# Patient Record
Sex: Female | Born: 1945 | Race: White | Hispanic: No | Marital: Single | Smoking: Current every day smoker
Health system: Southern US, Community
[De-identification: ages and names within clinical notes are randomized; demographics above are authoritative.]

## PROBLEM LIST (undated history)

## (undated) DIAGNOSIS — E7800 Pure hypercholesterolemia, unspecified: Secondary | ICD-10-CM

## (undated) DIAGNOSIS — I714 Abdominal aortic aneurysm, without rupture, unspecified: Secondary | ICD-10-CM

## (undated) DIAGNOSIS — M5135 Other intervertebral disc degeneration, thoracolumbar region: Secondary | ICD-10-CM

## (undated) HISTORY — PX: ABDOMINAL AORTIC ANEURYSM REPAIR: SUR1152

## (undated) HISTORY — PX: OTHER SURGICAL HISTORY: SHX169

## (undated) HISTORY — PX: ABDOMINAL HYSTERECTOMY: SHX81

## (undated) HISTORY — PX: UTERINE FIBROID SURGERY: SHX826

## (undated) HISTORY — PX: APPENDECTOMY: SHX54

## (undated) HISTORY — PX: TONSILLECTOMY: SUR1361

---

## 2015-01-06 DIAGNOSIS — I10 Essential (primary) hypertension: Secondary | ICD-10-CM | POA: Diagnosis not present

## 2015-01-06 DIAGNOSIS — F17200 Nicotine dependence, unspecified, uncomplicated: Secondary | ICD-10-CM | POA: Diagnosis not present

## 2015-01-06 DIAGNOSIS — I714 Abdominal aortic aneurysm, without rupture: Secondary | ICD-10-CM | POA: Diagnosis not present

## 2015-01-06 DIAGNOSIS — Z9862 Peripheral vascular angioplasty status: Secondary | ICD-10-CM | POA: Diagnosis not present

## 2015-01-06 DIAGNOSIS — Z48812 Encounter for surgical aftercare following surgery on the circulatory system: Secondary | ICD-10-CM | POA: Diagnosis not present

## 2015-01-27 DIAGNOSIS — K089 Disorder of teeth and supporting structures, unspecified: Secondary | ICD-10-CM | POA: Diagnosis not present

## 2015-02-19 ENCOUNTER — Observation Stay (HOSPITAL_COMMUNITY)
Admission: EM | Admit: 2015-02-19 | Discharge: 2015-02-21 | Disposition: A | Discharge status: Home / Self Care | Payer: Medicare Other | Attending: Oncology | Admitting: Oncology

## 2015-02-19 ENCOUNTER — Emergency Department (HOSPITAL_COMMUNITY): Payer: Medicare Other

## 2015-02-19 ENCOUNTER — Encounter (HOSPITAL_COMMUNITY): Payer: Self-pay

## 2015-02-19 DIAGNOSIS — K921 Melena: Secondary | ICD-10-CM | POA: Diagnosis not present

## 2015-02-19 DIAGNOSIS — N179 Acute kidney failure, unspecified: Secondary | ICD-10-CM | POA: Diagnosis not present

## 2015-02-19 DIAGNOSIS — E785 Hyperlipidemia, unspecified: Secondary | ICD-10-CM | POA: Diagnosis not present

## 2015-02-19 DIAGNOSIS — R6889 Other general symptoms and signs: Secondary | ICD-10-CM | POA: Diagnosis not present

## 2015-02-19 DIAGNOSIS — Z90710 Acquired absence of both cervix and uterus: Secondary | ICD-10-CM | POA: Insufficient documentation

## 2015-02-19 DIAGNOSIS — F17210 Nicotine dependence, cigarettes, uncomplicated: Secondary | ICD-10-CM | POA: Insufficient documentation

## 2015-02-19 DIAGNOSIS — R55 Syncope and collapse: Secondary | ICD-10-CM | POA: Diagnosis not present

## 2015-02-19 DIAGNOSIS — S6992XA Unspecified injury of left wrist, hand and finger(s), initial encounter: Secondary | ICD-10-CM | POA: Diagnosis not present

## 2015-02-19 DIAGNOSIS — F0390 Unspecified dementia without behavioral disturbance: Secondary | ICD-10-CM | POA: Diagnosis not present

## 2015-02-19 DIAGNOSIS — M25532 Pain in left wrist: Secondary | ICD-10-CM | POA: Diagnosis not present

## 2015-02-19 DIAGNOSIS — E780 Pure hypercholesterolemia: Secondary | ICD-10-CM | POA: Diagnosis not present

## 2015-02-19 DIAGNOSIS — M5135 Other intervertebral disc degeneration, thoracolumbar region: Secondary | ICD-10-CM | POA: Diagnosis not present

## 2015-02-19 DIAGNOSIS — K922 Gastrointestinal hemorrhage, unspecified: Secondary | ICD-10-CM | POA: Diagnosis not present

## 2015-02-19 DIAGNOSIS — Z9889 Other specified postprocedural states: Secondary | ICD-10-CM | POA: Insufficient documentation

## 2015-02-19 DIAGNOSIS — R001 Bradycardia, unspecified: Secondary | ICD-10-CM

## 2015-02-19 HISTORY — DX: Pure hypercholesterolemia, unspecified: E78.00

## 2015-02-19 HISTORY — DX: Other intervertebral disc degeneration, thoracolumbar region: M51.35

## 2015-02-19 HISTORY — DX: Abdominal aortic aneurysm, without rupture, unspecified: I71.40

## 2015-02-19 HISTORY — DX: Abdominal aortic aneurysm, without rupture: I71.4

## 2015-02-19 LAB — COMPREHENSIVE METABOLIC PANEL
ALK PHOS: 83 U/L (ref 38–126)
ALT: 14 U/L (ref 14–54)
ANION GAP: 9 (ref 5–15)
AST: 20 U/L (ref 15–41)
Albumin: 3.4 g/dL — ABNORMAL LOW (ref 3.5–5.0)
BUN: 32 mg/dL — ABNORMAL HIGH (ref 6–20)
CO2: 22 mmol/L (ref 22–32)
Calcium: 8.9 mg/dL (ref 8.9–10.3)
Chloride: 109 mmol/L (ref 101–111)
Creatinine, Ser: 1.38 mg/dL — ABNORMAL HIGH (ref 0.44–1.00)
GFR calc Af Amer: 44 mL/min — ABNORMAL LOW (ref >60)
GFR calc non Af Amer: 38 mL/min — ABNORMAL LOW (ref >60)
GLUCOSE: 125 mg/dL — AB (ref 65–99)
Potassium: 4.6 mmol/L (ref 3.5–5.1)
SODIUM: 140 mmol/L (ref 135–145)
TOTAL PROTEIN: 6.4 g/dL — AB (ref 6.5–8.1)
Total Bilirubin: 0.5 mg/dL (ref 0.3–1.2)

## 2015-02-19 LAB — I-STAT TROPONIN, ED: TROPONIN I, POC: 0 ng/mL (ref 0.00–0.08)

## 2015-02-19 LAB — CBC
HEMATOCRIT: 37.1 % (ref 36.0–46.0)
HEMATOCRIT: 35 % — AB (ref 36.0–46.0)
HEMOGLOBIN: 12.4 g/dL (ref 12.0–15.0)
Hemoglobin: 11.7 g/dL — ABNORMAL LOW (ref 12.0–15.0)
MCH: 28.1 pg (ref 26.0–34.0)
MCH: 27.9 pg (ref 26.0–34.0)
MCHC: 33.4 g/dL (ref 30.0–36.0)
MCHC: 33.4 g/dL (ref 30.0–36.0)
MCV: 83.9 fL (ref 78.0–100.0)
MCV: 83.5 fL (ref 78.0–100.0)
Platelets: 218 10*3/uL (ref 150–400)
Platelets: 224 10*3/uL (ref 150–400)
RBC: 4.42 MIL/uL (ref 3.87–5.11)
RBC: 4.19 MIL/uL (ref 3.87–5.11)
RDW: 15.1 % (ref 11.5–15.5)
RDW: 15.3 % (ref 11.5–15.5)
WBC: 12 10*3/uL — ABNORMAL HIGH (ref 4.0–10.5)
WBC: 9.6 10*3/uL (ref 4.0–10.5)

## 2015-02-19 LAB — PROTIME-INR
INR: 1.13 (ref 0.00–1.49)
Prothrombin Time: 14.7 seconds (ref 11.6–15.2)

## 2015-02-19 LAB — POC OCCULT BLOOD, ED: Fecal Occult Bld: POSITIVE — AB

## 2015-02-19 LAB — TYPE AND SCREEN
ABO/RH(D): B POS
Antibody Screen: NEGATIVE
DAT, IgG: NEGATIVE

## 2015-02-19 LAB — APTT: APTT: 28 s (ref 24–37)

## 2015-02-19 MED ORDER — ATORVASTATIN CALCIUM 20 MG PO TABS
20.0000 mg | ORAL_TABLET | Freq: Every day | ORAL | Status: DC
  Administered 2015-02-19 – 2015-02-20 (×2): 20 mg via ORAL
  Filled 2015-02-19 (×3): qty 1

## 2015-02-19 MED ORDER — SODIUM CHLORIDE 0.9 % IJ SOLN
3.0000 mL | Freq: Two times a day (BID) | INTRAMUSCULAR | Status: DC
  Administered 2015-02-20: 3 mL via INTRAVENOUS

## 2015-02-19 MED ORDER — PANTOPRAZOLE SODIUM 40 MG PO TBEC
40.0000 mg | DELAYED_RELEASE_TABLET | Freq: Every day | ORAL | Status: DC
  Administered 2015-02-20 – 2015-02-21 (×2): 40 mg via ORAL
  Filled 2015-02-19 (×2): qty 1

## 2015-02-19 MED ORDER — PANTOPRAZOLE SODIUM 40 MG IV SOLR: 40.0000 mg | Freq: Two times a day (BID) | INTRAVENOUS | Status: DC

## 2015-02-19 MED ORDER — DONEPEZIL HCL 10 MG PO TABS
10.0000 mg | ORAL_TABLET | Freq: Every day | ORAL | Status: DC
  Filled 2015-02-19: qty 1

## 2015-02-19 MED ORDER — SODIUM CHLORIDE 0.9 % IV SOLN
INTRAVENOUS | Status: AC
  Administered 2015-02-19 – 2015-02-20 (×2): via INTRAVENOUS

## 2015-02-19 MED ORDER — MEMANTINE HCL 10 MG PO TABS
10.0000 mg | ORAL_TABLET | Freq: Two times a day (BID) | ORAL | Status: DC
  Filled 2015-02-19: qty 1

## 2015-02-19 MED ORDER — PANTOPRAZOLE SODIUM 40 MG IV SOLR
80.0000 mg | Freq: Once | INTRAVENOUS | Status: AC
  Administered 2015-02-19: 80 mg via INTRAVENOUS
  Filled 2015-02-19: qty 80

## 2015-02-19 MED ORDER — SODIUM CHLORIDE 0.9 % IV SOLN
8.0000 mg/h | INTRAVENOUS | Status: DC
  Filled 2015-02-19 (×2): qty 80

## 2015-02-19 MED ORDER — ESCITALOPRAM OXALATE 10 MG PO TABS
10.0000 mg | ORAL_TABLET | Freq: Every day | ORAL | Status: DC
  Administered 2015-02-20 – 2015-02-21 (×2): 10 mg via ORAL
  Filled 2015-02-19 (×2): qty 1

## 2015-02-19 NOTE — ED Notes (Signed)
Lab called to report there was not enough blood in the tube to run CBC and must be recollected.

## 2015-02-19 NOTE — ED Notes (Signed)
Pt. From home. Pt. With hx of AAA approx 6 months ago. Pt. With bright red GI bleeding. Pale and clammy on arrival. Pt. Sitting on toilet upon EMS arrival and slow to answer questions. Pt. AxO x4 now. Pt. States she had a near syncope in the kitchen this AM and caught herself on her L wrist. Complaint of L wrist pain now.

## 2015-02-19 NOTE — ED Notes (Signed)
GI at bedside

## 2015-02-19 NOTE — Progress Notes (Signed)
Briana Hunter [id number] Admission Data: 02/19/2015 3:20 PM Attending Provider: Levert Feinstein, MD  PCP:No primary care provider on file. Consults/ Treatment Team: Treatment Team:  Hilarie Fredrickson, MD  Briana Hunter is a 69 y.o. female patient admitted from ED awake, alert  & orientated  X 1,  No Order, VSS - Blood pressure 132/60, pulse 52, temperature 97.9 F (36.6 C), temperature source Oral, resp. rate 10, SpO2 98 %., , no c/o shortness of breath, no c/o chest pain, no distress noted.   Allergies:  No Known Allergies   Past Medical History  Diagnosis Date  . AAA (abdominal aortic aneurysm)   . Hypercholesteremia   . DDD (degenerative disc disease), thoracolumbar     Pt orientation to unit, room and routine. Information packet given to patient/family and safety video watched.  Admission INP armband ID verified with patient/family, and in place. SR up x 2, fall risk assessment complete with Patient and family verbalizing understanding of risks associated with falls. Pt verbalizes an understanding of how to use the call bell and to call for help before getting out of bed.  Skin, clean-dry- intact without evidence of bruising, or skin tears.   No evidence of skin break down noted on exam.     Will cont to monitor and assist as needed.  Kern Reap, RN 02/19/2015 3:20 PM

## 2015-02-19 NOTE — H&P (Signed)
Date: 02/19/2015               Patient Name:  Briana Hunter MRN: [id number]  DOB: Feb 08, 1946 Age / Sex: 69 y.o., female   PCP: No primary care provider on file.         Medical Service: Internal [facility] Teaching Service         Attending Physician: Dr. Levert Feinstein, MD    First Contact: Dr. Allena Katz  Pager: [phone]   Second Contact: Dr. Yetta Barre  Pager: [phone]        After Hours (After 5p/  First Contact Pager: [phone]  weekends / holidays): Second Contact Pager: [phone]   Chief Complaint: Syncope  History of Present Illness: Briana Hunter is a 69 year old female with AAA status post repair, history of back surgery, dementia who presented with syncope and left wrist swelling. Her son Briana Hunter was present at bedside and contributed to the interview.  Earlier today, she was in the kitchen when she passed out on the floor for roughly 15 seconds though did not hit her head. Her son reports that she was propped up while on the floor and was noted to be shaking. He reports that she fluctuated in consciousness between 10-12 times and had some mild emesis. She herself does not recall the fall though remembers feeling warm and sweaty all over after drinking a cup of coffee. She denies any associated coughing, dizziness, numbness, tingling, prior history of syncope, prior history of CVA, palpitations, recent illness, sick contacts, recent bloody bowel movements, recent hematuria. Per EMS, her initial systolic blood pressures remained in the 80s prior to coming into the hospital.   In December 2015, she presented to [facility] with abdominal pain and was noted to be secondary to an 5.8 cm abdominal aortic aneurysm on CT scan. She underwent repair and was discharged on aspirin 81 mg, metoprolol tartrate 25 mg twice daily, atorvastatin 20 mg. At her follow-up on 01/06/15, abdominal ultrasound was without signs of dilation or peripheral aneurysms, and she was instructed to follow-up should any symptoms  develop. For her primary care, she follows with Briana Hunter] in [place] who prescribes her Namenda 10 mg, Lexapro 10 mg, Aricept 10 mg, Prilosec 20 mg. Her son reports that his grandmother also declined with dementia and that they were working on stopping the Aricept as it was felt it was due to personality changes.  She otherwise recent shortness of breath, chest pain, weight loss, nausea, diarrhea, vomiting, vaginal bleeding, auditory or visual hallucinations. At home, she lives with her son and is able to ambulate and climb stairs as well as perform ADLs. She continues to smoke 6-8 cigarettes a day but denies any recent alcohol use. She was previously living in [place], but her son felt that her dementia may be progressing and thus cares for her now.  In ED, she was noted to have light brown stools intermixed with bright red blood and small clots. FOBT was positive, and she was started on Protonix 80 mg IV. GI was consulted for possible colonoscopy.    Meds: Current Facility-Administered Medications  Medication Dose Route Frequency Provider Last Rate Last Dose  . pantoprazole (PROTONIX) 80 mg in sodium chloride 0.9 % 250 mL (0.32 mg/mL) infusion  8 mg/hr Intravenous Continuous Garlon Hatchet, PA-C      . [START ON 02/23/2015] pantoprazole (PROTONIX) injection 40 mg  40 mg Intravenous Q12H Garlon Hatchet, PA-C       Current Outpatient  Prescriptions  Medication Sig Dispense Refill  . atorvastatin (LIPITOR) 20 MG tablet Take 20 mg by mouth daily.    Marland Kitchen donepezil (ARICEPT) 10 MG tablet Take 10 mg by mouth at bedtime.    Marland Kitchen escitalopram (LEXAPRO) 10 MG tablet Take 10 mg by mouth daily.    . memantine (NAMENDA) 10 MG tablet Take 10 mg by mouth 2 (two) times daily.    . metoprolol tartrate (LOPRESSOR) 25 MG tablet Take 25 mg by mouth 2 (two) times daily.    Marland Kitchen omeprazole (PRILOSEC) 20 MG capsule Take 20 mg by mouth daily.      Allergies: Allergies as of 02/19/2015  . (No Known  Allergies)   Past Medical History  Diagnosis Date  . AAA (abdominal aortic aneurysm)   . Hypercholesteremia   . DDD (degenerative disc disease), thoracolumbar    Past Surgical History  Procedure Laterality Date  . Abdominal aortic aneurysm repair    . Appendectomy    . Tonsillectomy    . Low back surgery      mutiple levels, hardware fixation.   . Abdominal hysterectomy    . Uterine fibroid surgery     No family history on file. History   Social History  . Marital Status: Single    Spouse Name: N/A  . Number of Children: N/A  . Years of Education: N/A   Occupational History  . Not on file.   Social History Main Topics  . Smoking status: Current Every Day Smoker -- 0.50 packs/day  . Smokeless tobacco: Not on file  . Alcohol Use: No  . Drug Use: No  . Sexual Activity: Not on file   Other Topics Concern  . Not on file   Social History Narrative  . No narrative on file    Review of Systems: As noted in the history of present illness  Physical Exam: Blood pressure 132/60, pulse 52, temperature 97.9 F (36.6 C), temperature source Oral, resp. rate 10, SpO2 98 %. General: Elderly Caucasian female, resting in bed, NAD HEENT: Left pupil greater than right though reactive to light, EOMI, no scleral icterus, oropharynx clear Cardiac: Bradycardic, no rubs, murmurs or gallops Pulm: clear to auscultation bilaterally, no wheezes, rales, or rhonchi Abd: soft, nontender, nondistended, BS present Back: Surgical scar noted along side the lower back Ext: warm and well perfused, no pedal or tibial edema, left wrist tender to palpation and appears mildly swollen as compared to the right Neuro: CN II-XII intact, 5/5 upper and lower extremity strength, 2+ grip strength on right though deferred on left given tenderness, 2+patellar reflexes, oriented to place though not town or year [baseline per caregiver]   Lab results: Basic Metabolic Panel:  Recent Labs  00/92/33 1032  NA  140  K 4.6  CL 109  CO2 22  GLUCOSE 125*  BUN 32*  CREATININE 1.38*  CALCIUM 8.9   Liver Function Tests:  Recent Labs  02/19/15 1032  AST 20  ALT 14  ALKPHOS 83  BILITOT 0.5  PROT 6.4*  ALBUMIN 3.4*   CBC:  Recent Labs  02/19/15 1206  WBC 12.0*  HGB 12.4  HCT 37.1  MCV 83.9  PLT 218   Coagulation:  Recent Labs  02/19/15 1032  LABPROT 14.7  INR 1.13   Imaging results:  Dg Wrist Complete Left  02/19/2015   CLINICAL DATA:  Syncope, fall, left wrist pain  EXAM: LEFT WRIST - COMPLETE 3+ VIEW  COMPARISON:  None.  FINDINGS: Four views of  the left wrist submitted. No acute fracture or subluxation. Mild chondrocalcinosis. Mild narrowing of radiocarpal joint space. Degenerative changes first carpometacarpal joint.  IMPRESSION: No acute fracture or subluxation. Mild chondrocalcinosis. Narrowing of radiocarpal joint space. Degenerative changes first carpometacarpal joint.   Electronically Signed   By: Natasha Mead M.D.   On: 02/19/2015 10:51     Other results: EKG: Reviewed and compared with none prior. Sinus bradycardia Normal axis  T-wave flattening noted in V1, 1    Assessment & Plan by Problem:   Ms. Kueker is a 69 year old female with AAA status post repair, history of back surgery, dementia who presented with syncope and left wrist swelling found to have rectal bleeding.  Syncope: Orthostatic syncope possible given the low systolic blood pressures noted by EMS in the setting of along with sinus bradycardia from beta blocker and anticholinergic therapy. Rectal bleeding possibly contributory though hemoglobin 12.4 and admission appears reassuring as it was 9.1 at the time of discharge of her most recent hospitalization. Cardiogenic syncope possible given the sinus bradycardia. Vasovagal syncope possible given her prodromal symptoms though less likely as she does not recall any activities that can increase vagal tone, like coughing, defecation, micturition prior to the  event. Neurologic syncope secondary to causes like TIA/CVA possible given her vascular disease and other risk factors though neurologic exam reassuring. Seizure also less likely as she recalls the events following her fall which would be consistent with a postictal state.  -Check orthostatics and start normal saline at 100 mL/hour -Holding home metoprolol and Aricept, Namenda -Monitor on telemetry & repeat EKG in the morning -Continue neuro checks every 4 hours -Recheck BMET and CBC tomorrow morning -Discontinue Protonix IV and continue Protonix 40 mg daily  Rectal bleeding: Hemoglobin 12.4 on admission. Per records that [facility], her hemoglobin trended down from 13 on 09/04/14 to 9.1 on 09/09/14 at the time of discharge. Unclear why she is having rectal bleeding as she has had no prior history of colonoscopy or GI disease. Per GI assessment, causes include diverticula, hemorrhoids, cancer; cancer is somewhat less likely in the absence of overt weight loss and other progressive GI symptoms.  -CBC as noted above -Consult GI for possible colonoscopy pending completion of syncope workup per their recommendations  Acute kidney injury: Creatinine 1.3 on admission which is consistent with GFR 38, baseline 0.9-1.0 and her most recent hospitalization. Likely prerenal in the setting of acute blood loss and orthostatic hypotension. -IV fluids as noted above  Left wrist swelling: Left wrist x-ray on admission unremarkable for acute fracture. Likely reactive. -Continue assessing symptoms  Hyperglycemia: Glucose 125 on admission though was noted to be as high as 198 at her most recent hospitalization. Does not appear that she has ever been diagnosed with diabetes. -Check A1c  Dementia: She is at her mental baseline per her son. -Continue Lexapro  Hyperlipidemia: Continue Lipitor 40 mg.  Tobacco abuse: Consider nicotine replacement patch per patient request.  #FEN:  -Diet: Clears  #DVT prophylaxis:  SCDs   #CODE STATUS: FULL CODE -Defer to Briana Hunter, son and HCPOA [[phone]] if patients lacks decision-making capacity -Confirmed with patient on admission  Dispo: Disposition is deferred at this time, awaiting improvement of current medical problems.   The patient does have a current PCP (No primary care provider on file.) and does not need an [facility] hospital follow-up appointment after discharge.  The patient does have transportation limitations that hinder transportation to clinic appointments.  Signed: Beather Arbour, MD 02/19/2015, 2:53  PM

## 2015-02-19 NOTE — ED Provider Notes (Signed)
CSN: [id number]     Arrival date & time 02/19/15  1014 History   First MD Initiated Contact with Patient 02/19/15 1015     Chief Complaint  Patient presents with  . GI Bleeding     (Consider location/radiation/quality/duration/timing/severity/associated sxs/prior Treatment) The history is provided by the patient and medical records.   This is a 69 year old female with past medical history significant for hypercholesterolemia and AAA repair approximately 6 months ago at [facility], presenting to the ED for rectal bleeding. Patient was getting ready for her granddaughter's wedding when she used the bathroom and noticed there was bright red blood. She states she did have near syncope where she fell, caught herself on her left wrist.  She does have some left wrist pain.  She did not strike her head or lose consciousness.  She states initially she felt somewhat lightheaded but is feeling better at this time.  Patient is not currently on anti-coagulation--patient is supposed to be on ASA, however has not been taking it.  No hx of GI bleeding.  No prior colonoscopy.  Denies abdominal pain, nausea, vomiting, diarrhea.  No chest pain or SOB.  Patient had follow-up in April 2016 with vascular, cleared from their service until repeat scan needed in 3 years.  Initial BP 80 systolic, improved on arrival here.   Past Medical History  Diagnosis Date  . AAA (abdominal aortic aneurysm)   . Hypercholesteremia    Past Surgical History  Procedure Laterality Date  . Abdominal aortic aneurysm repair    . Appendectomy    . Tonsillectomy     No family history on file. History  Substance Use Topics  . Smoking status: Current Every Day Smoker -- 0.50 packs/day  . Smokeless tobacco: Not on file  . Alcohol Use: No   OB History    No data available     Review of Systems  Gastrointestinal: Positive for blood in stool.  Musculoskeletal: Positive for arthralgias.  Neurological: Positive for syncope.  All other  systems reviewed and are negative.     Allergies  Review of patient's allergies indicates no known allergies.  Home Medications   Prior to Admission medications   Not on File   BP 121/52 mmHg  Pulse 51  Temp(Src) 97.9 F (36.6 C) (Oral)  Resp 10  SpO2 99%   Physical Exam  Constitutional: She is oriented to person, place, and time. She appears well-developed and well-nourished.  HENT:  Head: Normocephalic and atraumatic.  Mouth/Throat: Oropharynx is clear and moist.  Eyes: Conjunctivae and EOM are normal. Pupils are equal, round, and reactive to light.  Neck: Normal range of motion. Neck supple.  Cardiovascular: Normal rate, regular rhythm and normal heart sounds.   Pulmonary/Chest: Effort normal and breath sounds normal. No respiratory distress. She has no wheezes.  Abdominal: Soft. Bowel sounds are normal.  Genitourinary:  Loose, light brown stool noted with intermixed bright red blood, few small clots noted; no noted hemorrhoids or fissures  Musculoskeletal: Normal range of motion.  Neurological: She is alert and oriented to person, place, and time.  Skin: Skin is warm and dry.  Psychiatric: She has a normal mood and affect.  Nursing note and vitals reviewed.   ED Course  Procedures (including critical care time) Labs Review Labs Reviewed  COMPREHENSIVE METABOLIC PANEL - Abnormal; Notable for the following:    Glucose, Bld 125 (*)    BUN 32 (*)    Creatinine, Ser 1.38 (*)    Total Protein 6.4 (*)  Albumin 3.4 (*)    GFR calc non Af Amer 38 (*)    GFR calc Af Amer 44 (*)    All other components within normal limits  CBC - Abnormal; Notable for the following:    WBC 12.0 (*)    All other components within normal limits  POC OCCULT BLOOD, ED - Abnormal; Notable for the following:    Fecal Occult Bld POSITIVE (*)    All other components within normal limits  PROTIME-INR  APTT  CBC WITH DIFFERENTIAL/PLATELET  I-STAT TROPOININ, ED  TYPE AND SCREEN     Imaging Review No results found.   EKG Interpretation None      MDM   Final diagnoses:  Gastrointestinal hemorrhage, unspecified gastritis, unspecified gastrointestinal hemorrhage type  Near syncope  History of AAA (abdominal aortic aneurysm) repair   69 y.o. F with GI bleeding + near syncopal episode PTA.  Initial pressures in the field were 80 systolic, 121/52 on arrival to ED.  Patient afebrile, non-toxic.  Abdominal exam benign.  Stool is light brown intermixed right right red blood and small clots.  Will obtain labs, x-ray of left wrist from fall.    Lab work as above-- H/H stable.  Hemoccult grossly +.  X-ray negative for fracture.  Given ongoing bleeding and near syncopal event, patient will likely need colonoscopy and will be admitted for further management. Patient given protonix bolus and start drip.  Case discussed with GI, PA [place]-- will see patient in consult.  Patient admitted to IM teaching service for further management.  VS remain stable.  Garlon Hatchet, PA-C 02/19/15 1431  Benjiman Core, MD 02/19/15 [phone]

## 2015-02-19 NOTE — Progress Notes (Signed)
Utilization review complete. Tahji  RN CCM Case Mgmt phone [phone] 

## 2015-02-19 NOTE — Consult Note (Signed)
[facility] Consult: 2:49 PM 02/19/2015  LOS: 0 days    Referring Provider: ED PA Evangeline Gula Primary Care Physician:  [facility] in [place].  Primary Gastroenterologist:  unassigned    Reason for Consultation:  Hematochezia.    HPI: Briana Hunter is a 69 y.o. female.  PMH dementia. 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  Previously lived alone in [place] but came to live with son/dtr in law in fall 2015. .    This AM, pt had repeated syncopal spells at home and passed moderate amount of blood per rectum, with clots, into commode. No abdominal pain.  Felt weak, clammy.  Per EMS, initial SBP 80s but BP has responded to IVF.  Once in ED, pt had brown stool in which small amount of blood was visible.  Hgb is 12.4.  Hgb was 12.7 post op. Coags wnl.  FOBT+.   BUN/Creat elevated and were consistently normal pre and post op  No prior GI bleed.  No previous colonoscopy or EGD.  No NSAIDs or ASA.  No n/v.  Eats well, weight stable.  Doing well at all per post op follow ups.  Duplex study 01/06/15: widely patent bifurcated aortic aneurysm repair. No evidence of any significant abdominal aortic dilation. No evidence of any peripheral aneurysms No chest pain or palpitations, no dyspnea.  No hx CAD or CVA.  No previous cardiac cath.  No previous anemia or transfusions.     Past Medical History  Diagnosis Date  . AAA (abdominal aortic aneurysm)   . Hypercholesteremia   . DDD (degenerative disc disease), thoracolumbar     Past Surgical History  Procedure Laterality Date  . Abdominal aortic aneurysm repair    . Appendectomy    . Tonsillectomy    . Low back surgery      mutiple levels, hardware fixation.   . Abdominal hysterectomy    . Uterine fibroid surgery       Prior to Admission medications   Medication Sig Start Date End Date Taking? Authorizing Provider  atorvastatin (LIPITOR) 20 MG tablet Take 20 mg by mouth daily.   Yes Historical Provider, MD  donepezil (ARICEPT) 10 MG tablet Take 10 mg by mouth at bedtime.   Yes Historical Provider, MD  escitalopram (LEXAPRO) 10 MG tablet Take 10 mg by mouth daily.   Yes Historical Provider, MD  memantine (NAMENDA) 10 MG tablet Take 10 mg by mouth 2 (two) times daily.   Yes Historical Provider, MD  metoprolol tartrate (LOPRESSOR) 25 MG tablet Take 25 mg by mouth 2 (two) times daily.   Yes Historical Provider, MD  omeprazole (PRILOSEC) 20 MG capsule Take 20 mg by mouth daily.   Yes Historical Provider, MD    Scheduled Meds: . [START ON 02/23/2015] pantoprazole (PROTONIX) IV  40 mg Intravenous Q12H   Infusions: . pantoprazole (PROTONIX) IV 80 mg (02/19/15 1432)  . pantoprozole (PROTONIX) infusion     PRN Meds:    Allergies as of 02/19/2015  . (No Known Allergies)  No family history on file.  History   Social History  . Marital Status: Single    Spouse Name: N/A  . Number of Children: N/A  . Years of Education: N/A   Occupational History  . Not on file.   Social History Main Topics  . Smoking status: Current Every Day Smoker -- 0.50 packs/day  . Smokeless tobacco: Not on file  . Alcohol Use: No  . Drug Use: No  . Sexual Activity: Not on file   Other Topics Concern  . Not on file   Social History Narrative  . No narrative on file    REVIEW OF SYSTEMS: Constitutional:  Sedentary.   ENT:  No nose bleeds Pulm:  No DOE or cough.  Smokes 6 cigs per day, used to be 2ppd smoker CV:  No palpitations, no LE edema.  GU:  No hematuria, no frequency GI:  Per HPI Heme: no previous issues with excessive bleeding or bruising.    Transfusions:  None ever MS:  Left wrist pain from falling today, xray  Neuro:  No headaches, no peripheral tingling or numbness Derm:  No itching, no rash  or sores.  Endocrine:  No sweats or chills.  No polyuria or dysuria Immunization:  Not queried Travel:  None beyond local counties in last few months.    PHYSICAL EXAM: Vital signs in last 24 hours: Filed Vitals:   02/19/15 1430  BP: 132/60  Pulse: 52  Temp:   Resp: 10   Wt Readings from Last 3 Encounters:  No data found for Wt   General: pleasant, comfortable.  Does not look ill Head:  No signs of trauma, no swelling or asymmetry  Eyes:  No conj pallor.  EOMI Ears:  Not HOH  Nose:  No congestion or discharge Mouth:  All upper teeth and many lower teeth gone.  Moist, pink and clear oral MM. Neck:  No mass or TMG.  No JVD Lungs:  Clear bil.  No ronchi, rales or wheexes.  Not SOB.  No cough Heart: RRR.  No MRG Abdomen:  Soft, NT, ND.  Well healed long, lower midline incision scar.  No mass, no bruits. No HSM   Rectal: , no blood or stool .   Musc/Skeltl: no joint erythema or swelling.  Well healed spinal incision scar on thoracic to lumbar spine. Extremities:  No CCE.  Feet warm  Neurologic:  No tremor or agitation.  Oriented to her DOB and name.  Not aware of year, place, town but appropriately answers "I don't know" Skin:  No telangectasia or rash.  Tattoos:  none Nodes:  No cervical or inguinal nodes   Psych:  Pleasant, cooperative, follows commands.  Not anxious or agitated.   Intake/Output from previous day:   Intake/Output this shift:    LAB RESULTS:  Recent Labs  02/19/15 1206  WBC 12.0*  HGB 12.4  HCT 37.1  PLT 218   BMET Lab Results  Component Value Date   NA 140 02/19/2015   K 4.6 02/19/2015   CL 109 02/19/2015   CO2 22 02/19/2015   GLUCOSE 125* 02/19/2015   BUN 32* 02/19/2015   CREATININE 1.38* 02/19/2015   CALCIUM 8.9 02/19/2015   LFT  Recent Labs  02/19/15 1032  PROT 6.4*  ALBUMIN 3.4*  AST 20  ALT 14  ALKPHOS 83  BILITOT 0.5   PT/INR Lab Results  Component Value Date   INR 1.13 02/19/2015       RADIOLOGY STUDIES: Dg  Wrist Complete Left  02/19/2015   CLINICAL DATA:  Syncope, fall, left wrist pain  EXAM: LEFT WRIST - COMPLETE 3+ VIEW  COMPARISON:  None.  FINDINGS: Four views of the left wrist submitted. No acute fracture or subluxation. Mild chondrocalcinosis. Mild narrowing of radiocarpal joint space. Degenerative changes first carpometacarpal joint.  IMPRESSION: No acute fracture or subluxation. Mild chondrocalcinosis. Narrowing of radiocarpal joint space. Degenerative changes first carpometacarpal joint.   Electronically Signed   By: Natasha Mead M.D.   On: 02/19/2015 10:51    ENDOSCOPIC STUDIES: No EGD or colonoscopy  IMPRESSION:   *  Painless hematochezia. Volume does not sound large.  R/o diverticular source, r/ hemorrhoidal (none seen or palpated on exam though), r/o neoplasia  *  Syncope. Suspect related to GIB and hypotension.  No head imaging thus far.   *  AKI   *  Dementia.  On Aricept and Namenda.    *  S/p aortobifem repair of juxtarenal aortic aneurysm 08/2014.    PLAN:     *  Observe.  Stop Protonix drip, resume her usual once daily oral PPI.   *  Clears. CBC in AM.    Jennye Moccasin  02/19/2015, 2:49 PM Pager: [phone]  GI ATTENDING  History, laboratories reviewed. Patient personally seen and examined. Agree with consultation note as outlined above. I also discussed the history with her son and emergency room medical assistant. Patient was brought in with syncope. She is a poor historian due to dementia. Apparently EMS reported noticing blood in the toilet bowl after she defecated. Bowel movement in the ER was described as brown with streaks of red blood. My rectal examination revealed no stool in the vault. There was scant brown material with mucus and pink tinge of blood. Vital signs stable. Hemoglobin normal. IMPRESSION 1. Minor rectal bleeding 2. Syncope.  Unrelated to minor GI bleeding RECOMMENDATIONS 1. Primary service to evaluate syncope 2. GI available if needed over the  weekend. After syncope workup completed, and if no contraindication, could perform colonoscopy to evaluate minor rectal bleeding. Dr. Elnoria Howard on call this weekend should assistance be needed.  Discussed with son  Wilhemina Bonito. Eda Keys., M.D. [facility] of Gastroenterology

## 2015-02-20 DIAGNOSIS — R55 Syncope and collapse: Secondary | ICD-10-CM | POA: Diagnosis not present

## 2015-02-20 DIAGNOSIS — R001 Bradycardia, unspecified: Secondary | ICD-10-CM

## 2015-02-20 DIAGNOSIS — E785 Hyperlipidemia, unspecified: Secondary | ICD-10-CM

## 2015-02-20 DIAGNOSIS — F17200 Nicotine dependence, unspecified, uncomplicated: Secondary | ICD-10-CM

## 2015-02-20 DIAGNOSIS — F0390 Unspecified dementia without behavioral disturbance: Secondary | ICD-10-CM

## 2015-02-20 DIAGNOSIS — K921 Melena: Secondary | ICD-10-CM | POA: Diagnosis not present

## 2015-02-20 DIAGNOSIS — M7989 Other specified soft tissue disorders: Secondary | ICD-10-CM

## 2015-02-20 DIAGNOSIS — R7309 Other abnormal glucose: Secondary | ICD-10-CM

## 2015-02-20 DIAGNOSIS — N179 Acute kidney failure, unspecified: Secondary | ICD-10-CM

## 2015-02-20 DIAGNOSIS — Z9889 Other specified postprocedural states: Secondary | ICD-10-CM | POA: Insufficient documentation

## 2015-02-20 LAB — CBC
HCT: 34.2 % — ABNORMAL LOW (ref 36.0–46.0)
Hemoglobin: 11.3 g/dL — ABNORMAL LOW (ref 12.0–15.0)
MCH: 27.8 pg (ref 26.0–34.0)
MCHC: 33 g/dL (ref 30.0–36.0)
MCV: 84 fL (ref 78.0–100.0)
PLATELETS: 189 10*3/uL (ref 150–400)
RBC: 4.07 MIL/uL (ref 3.87–5.11)
RDW: 15.3 % (ref 11.5–15.5)
WBC: 7.5 10*3/uL (ref 4.0–10.5)

## 2015-02-20 LAB — BASIC METABOLIC PANEL
Anion gap: 5 (ref 5–15)
BUN: 19 mg/dL (ref 6–20)
CHLORIDE: 109 mmol/L (ref 101–111)
CO2: 25 mmol/L (ref 22–32)
Calcium: 8.6 mg/dL — ABNORMAL LOW (ref 8.9–10.3)
Creatinine, Ser: 1.07 mg/dL — ABNORMAL HIGH (ref 0.44–1.00)
GFR calc non Af Amer: 51 mL/min — ABNORMAL LOW (ref >60)
GFR, EST AFRICAN AMERICAN: 60 mL/min — AB (ref >60)
Glucose, Bld: 91 mg/dL (ref 65–99)
POTASSIUM: 3.6 mmol/L (ref 3.5–5.1)
Sodium: 139 mmol/L (ref 135–145)

## 2015-02-20 MED ORDER — ASPIRIN 81 MG PO CHEW
81.0000 mg | CHEWABLE_TABLET | Freq: Every day | ORAL | Status: DC
  Administered 2015-02-20 – 2015-02-21 (×2): 81 mg via ORAL
  Filled 2015-02-20 (×2): qty 1

## 2015-02-20 NOTE — Progress Notes (Addendum)
Subjective: This morning, her son was at bedside. She denied any complaints, including bloody bowel movements. We explained to her that we think her symptoms yesterday may been precipitated by one of the medications she was started on following her most recent surgery. Overnight review of telemetry was notable for sinus bradycardia, we explained that we will monitor her closely as we weigh the risks and benefits of restarting this medication given her recent procedure.  Per chart review, at her last office visit with vascular surgery, her pulse was noted to be 51 with instructions to continue aspirin but no mention of metoprolol.   Objective: Vital signs in last 24 hours: Filed Vitals:   02/19/15 1834 02/19/15 1840 02/19/15 2124 02/20/15 0540  BP:   139/70 143/70  Pulse:   49 56  Temp: 98.8 F (37.1 C)  97.9 F (36.6 C) 98.6 F (37 C)  TempSrc: Oral  Oral Oral  Resp: 20  18 18   Height:  5\' 6"  (1.676 m)    Weight:  131 lb 1.6 oz (59.467 kg)    SpO2: 100%  99% 98%   Weight change:   Intake/Output Summary (Last 24 hours) at 02/20/15 1020 Last data filed at 02/19/15 1844  Gross per 24 hour  Intake 393.33 ml  Output      0 ml  Net 393.33 ml   General: Elderly Caucasian female, resting in bed, NAD HEENT: Pupils equal in size and reactive to light, cataract surgery, EOMI, no scleral icterus, oropharynx clear Cardiac: Bradycardic, no rubs, murmurs or gallops Pulm: clear to auscultation bilaterally, no wheezes, rales, or rhonchi Abd: soft, nontender, nondistended, BS present Back: Surgical scar noted along side the lower back Ext: warm and well perfused, covered in SCDs, left wrist mildly swollen as compared to the right stable from yesterday Neuro: Responds to questions appropriately, moving all extremities spontaneously  Lab Results: Basic Metabolic Panel:  Recent Labs Lab 02/19/15 1032 02/20/15 0616  NA 140 139  K 4.6 3.6  CL 109 109  CO2 22 25  GLUCOSE 125* 91  BUN  32* 19  CREATININE 1.38* 1.07*  CALCIUM 8.9 8.6*   Liver Function Tests:  Recent Labs Lab 02/19/15 1032  AST 20  ALT 14  ALKPHOS 83  BILITOT 0.5  PROT 6.4*  ALBUMIN 3.4*   CBC:  Recent Labs Lab 02/19/15 1630 02/20/15 0616  WBC 9.6 7.5  HGB 11.7* 11.3*  HCT 35.0* 34.2*  MCV 83.5 84.0  PLT 224 189   Coagulation:  Recent Labs Lab 02/19/15 1032  LABPROT 14.7  INR 1.13   Studies/Results: Dg Wrist Complete Left  02/19/2015   CLINICAL DATA:  Syncope, fall, left wrist pain  EXAM: LEFT WRIST - COMPLETE 3+ VIEW  COMPARISON:  None.  FINDINGS: Four views of the left wrist submitted. No acute fracture or subluxation. Mild chondrocalcinosis. Mild narrowing of radiocarpal joint space. Degenerative changes first carpometacarpal joint.  IMPRESSION: No acute fracture or subluxation. Mild chondrocalcinosis. Narrowing of radiocarpal joint space. Degenerative changes first carpometacarpal joint.   Electronically Signed   By: Briana Hunter M.D.   On: 02/19/2015 10:51   Medications: I have reviewed the patient's current medications. Scheduled Meds: . atorvastatin  20 mg Oral q1800  . escitalopram  10 mg Oral Daily  . pantoprazole  40 mg Oral Q0600  . sodium chloride  3 mL Intravenous Q12H   Continuous Infusions:  PRN Meds:. Assessment/Plan:  Briana Hunter is a 69 year old female with AAA status post repair,  history of back surgery, dementia who presented with syncope and left wrist swelling found to have rectal bleeding.  Syncope: Likely multifactorial given her prodrome noted in the setting of sinus bradycardia secondary to her medications [metoprolol with some contributory effects from Aricept]. Orthostatic vital signs reassuring yesterday, and her hemoglobin is stable at 11-12 since admission. No changes neurologically since yesterday. EKG this morning notable for heart rate 69 though telemetry overnight showed persistent sinus bradycardia off her home metoprolol. -Continue to hold  metoprolol after speaking with Dr. Elease Hashimoto [Cardiology] given her persistent sinus bradycardia but will monitor her blood pressure for other antihypertensive therapy -Holding Aricept and Namenda for potentially contributory effects -Monitor on telemetry   Rectal bleeding: Hemoglobin stable as noted above. Causes of her rectal bleeding include diverticula, hemorrhoids, cancer; cancer is somewhat less likely in the absence of overt weight loss and other progressive GI symptoms. No prior history of colonoscopy or GI disease.  -CBC as noted above -Defer colonoscopy for outpatient unless she has a significant drop in Hb -Resume home ASA  -Continue Protonix 40 mg daily  Acute kidney injury: Creatinine 1.1, improved from 1.3 on admission, baseline 0.9-1.0 at her most recent hospitalization. Likely prerenal though she did not have significant blood loss or orthostatic vital signs. -Recheck BMET  Left wrist swelling: Left wrist x-ray on admission unremarkable for acute fracture. Swelling likely reactive. -Continue assessing symptoms  Hyperglycemia: Glucose 91, improved from 125 on admission though was noted to be as high as 198 at her most recent hospitalization.  -A1c pending  Dementia: She is at her mental baseline per her son. -Continue Lexapro  Hyperlipidemia: Continue Lipitor 40 mg.  Tobacco abuse: Consider nicotine replacement patch per patient request.  #FEN:  -Diet: Regular  #DVT prophylaxis: SCDs  #CODE STATUS: FULL CODE -Defer to Briana Hunter, son and HCPOA [phone]] if patients lacks decision-making capacity -Confirmed with patient on admission  Dispo: Disposition is deferred at this time, awaiting improvement of current medical problems.   The patient does have a current PCP Briana Ohara, MD) and does not need an [facility] hospital follow-up appointment after discharge.  The patient does have transportation limitations that hinder transportation to clinic  appointments.  .Services Needed at time of discharge: Y = Yes, Blank = No PT:   OT:   RN:   Equipment:   Other:     LOS: 1 day   Briana Arbour, MD 02/20/2015, 10:20 AM

## 2015-02-20 NOTE — Discharge Summary (Signed)
Name: Briana Hunter MRN: [id number] DOB: [birth year] 70 y.o. PCP: Briana Ohara, MD  Date of Admission: 02/19/2015 10:14 AM Date of Discharge: 02/21/15 Attending Physician: Dr. Cyndie Chime  Discharge Diagnosis: Syncope of multifactorial etiology Rectal bleeding Acute kidney injury Left wrist swelling Dementia AAA status post repair Hyperlipidemia Tobacco abuse  Discharge Medications:   Medication List    STOP taking these medications        donepezil 10 MG tablet  Commonly known as:  ARICEPT     memantine 10 MG tablet  Commonly known as:  NAMENDA     metoprolol tartrate 25 MG tablet  Commonly known as:  LOPRESSOR      TAKE these medications        amLODipine 5 MG tablet  Commonly known as:  NORVASC  Take 1 tablet (5 mg total) by mouth daily.     aspirin 81 MG chewable tablet  Chew 1 tablet (81 mg total) by mouth daily.     atorvastatin 20 MG tablet  Commonly known as:  LIPITOR  Take 20 mg by mouth daily.     escitalopram 10 MG tablet  Commonly known as:  LEXAPRO  Take 10 mg by mouth daily.     omeprazole 20 MG capsule  Commonly known as:  PRILOSEC  Take 20 mg by mouth daily.        Disposition and follow-up:   Briana Hunter was discharged from [facility] in Stable condition.  At the hospital follow up visit please address:  Discontinuing Aricept, Namenda  Blood pressure control with Norvasc  Resolution of rectal bleeding: Referral for outpatient colonoscopy  Resolution of AKI: Recheck BMET   Follow-up Appointments: Follow-up Information    Follow up with Briana Ohara, MD. Schedule an appointment as soon as possible for a visit in 1 week.   Specialty:  Family Medicine   Contact information:   [address] [place] [address] [phone]       Discharge Instructions:   Consultations: Treatment Team:  Hilarie Fredrickson, MD  Procedures Performed:  Dg Wrist Complete Left  02/19/2015   CLINICAL DATA:  Syncope,  fall, left wrist pain  EXAM: LEFT WRIST - COMPLETE 3+ VIEW  COMPARISON:  None.  FINDINGS: Four views of the left wrist submitted. No acute fracture or subluxation. Mild chondrocalcinosis. Mild narrowing of radiocarpal joint space. Degenerative changes first carpometacarpal joint.  IMPRESSION: No acute fracture or subluxation. Mild chondrocalcinosis. Narrowing of radiocarpal joint space. Degenerative changes first carpometacarpal joint.   Electronically Signed   By: Natasha Mead M.D.   On: 02/19/2015 10:51     Admission HPI: Briana Hunter is a 69 year old female with AAA status post repair, history of back surgery, dementia who presented with syncope and left wrist swelling. [name] and contributed to the interview.  Earlier today, she was in the kitchen when she passed out on the floor for roughly 15 seconds though did not hit her head. Her son reports that she was propped up while on the floor and was noted to be shaking. He reports that she fluctuated in consciousness between 10-12 times and had some mild emesis. She herself does not recall the fall though remembers feeling warm and sweaty all over after drinking a cup of coffee. She denies any associated coughing, dizziness, numbness, tingling, prior history of syncope, prior history of CVA, palpitations, recent illness, sick contacts, recent bloody bowel movements, recent hematuria. Per EMS, her initial systolic  blood pressures remained in the 80s prior to coming into the hospital.   In December 2015, she presented to [facility] with abdominal pain and was noted to be secondary to an 5.8 cm abdominal aortic aneurysm on CT scan. She underwent repair and was discharged on aspirin 81 mg, metoprolol tartrate 25 mg twice daily, atorvastatin 20 mg. At her follow-up on 01/06/15, abdominal ultrasound was without signs of dilation or peripheral aneurysms, and she was instructed to follow-up should any symptoms develop. For her primary care,  she follows with Dr. [facility]] in [place] who prescribes her Namenda 10 mg, Lexapro 10 mg, Aricept 10 mg, Prilosec 20 mg. Her son reports that his grandmother also declined with dementia and that they were working on stopping the Aricept as it was felt it was due to personality changes.  She otherwise recent shortness of breath, chest pain, weight loss, nausea, diarrhea, vomiting, vaginal bleeding, auditory or visual hallucinations. At home, she lives with her son and is able to ambulate and climb stairs as well as perform ADLs. She continues to smoke 6-8 cigarettes a day but denies any recent alcohol use. She was previously living in [place], but her son felt that her dementia may be progressing and thus cares for her now.  In ED, she was noted to have light brown stools intermixed with bright red blood and small clots. FOBT was positive, and she was started on Protonix 80 mg IV. GI was consulted for possible colonoscopy.  Hospital Course by problem list:  Syncope: Likely multifactorial given her prodrome noted in the setting of sinus bradycardia secondary to her medications [metoprolol with some contributory effects from Aricept]. Orthostatic vital signs reassuring on admission though she was orthostatic on the day of discharge and received 500 mL fluid bolus. Aricept and metoprolol were held throughout her hospital stay, and telemetry findings was notable for sinus bradycardia. Cardiology agreed with management and recommended another antihypertensive, so she was started on Norvasc 5 mg. Please assess blood pressure control the time of follow-up.  Rectal bleeding: GI was consulted and attributed her rectal bleeding to hemorrhoids, diverticula, or neoplasia but deferred colonoscopy to outpatient as her hemoglobin was stable as noted above. She did report some loose stools with scant blood on the day of discharge, and the symptoms should be reassessed at the time of follow-up along with  referral for colonoscopy.   Acute kidney injury: Likely prerenal in the setting of acute blood loss and orthostatic hypotension. Creatinine 1.3 initially trended down on hospital day 1 the return to 1 throughout the day of discharge, which is above her baseline 0.9-1.0. BMET should be rechecked at the time of follow-up to assess resolution of AKI.  Left wrist swelling: Likely reactive to trauma sustained during her fall. Left wrist x-ray on admission unremarkable for acute fracture.  Hyperglycemia: Glucose 125 on admission. A1c was noted to be 5.8.   Dementia remained stable on Lexapro. Namenda and Aricept were discontinued as they are thought to be contributory to her syncopal episode.  Hyperlipidemia: Stable on home medications  Tobacco abuse: She is advised smoking cessation and should be followed up at the time of her next appointment.  AAA status post repair: She was continued on aspirin 81 mg.  Discharge Vitals:   BP 141/76 mmHg  Pulse 67  Temp(Src) 98.3 F (36.8 C) (Oral)  Resp 18  Ht  (1.676 m)  Wt 131 lb 1.6 oz (59.467 kg)  BMI 21.17 kg/m2  SpO2  100%  Discharge Labs:  No results found for this or any previous visit (from the past 24 hour(s)).  Signed: Beather Arbour, MD 02/25/2015, 4:25 PM    Services Ordered on Discharge: None  Equipment Ordered on Discharge: None

## 2015-02-21 DIAGNOSIS — F0390 Unspecified dementia without behavioral disturbance: Secondary | ICD-10-CM | POA: Diagnosis not present

## 2015-02-21 DIAGNOSIS — N179 Acute kidney failure, unspecified: Secondary | ICD-10-CM | POA: Diagnosis not present

## 2015-02-21 DIAGNOSIS — K921 Melena: Secondary | ICD-10-CM | POA: Diagnosis not present

## 2015-02-21 DIAGNOSIS — R55 Syncope and collapse: Secondary | ICD-10-CM | POA: Diagnosis not present

## 2015-02-21 LAB — BASIC METABOLIC PANEL
Anion gap: 7 (ref 5–15)
BUN: 20 mg/dL (ref 6–20)
CALCIUM: 8.8 mg/dL — AB (ref 8.9–10.3)
CO2: 23 mmol/L (ref 22–32)
CREATININE: 1.26 mg/dL — AB (ref 0.44–1.00)
Chloride: 108 mmol/L (ref 101–111)
GFR calc non Af Amer: 42 mL/min — ABNORMAL LOW (ref >60)
GFR, EST AFRICAN AMERICAN: 49 mL/min — AB (ref >60)
GLUCOSE: 97 mg/dL (ref 65–99)
POTASSIUM: 4 mmol/L (ref 3.5–5.1)
SODIUM: 138 mmol/L (ref 135–145)

## 2015-02-21 LAB — CBC
HCT: 33.5 % — ABNORMAL LOW (ref 36.0–46.0)
Hemoglobin: 11.1 g/dL — ABNORMAL LOW (ref 12.0–15.0)
MCH: 28.5 pg (ref 26.0–34.0)
MCHC: 33.1 g/dL (ref 30.0–36.0)
MCV: 85.9 fL (ref 78.0–100.0)
PLATELETS: 201 10*3/uL (ref 150–400)
RBC: 3.9 MIL/uL (ref 3.87–5.11)
RDW: 15 % (ref 11.5–15.5)
WBC: 8.1 10*3/uL (ref 4.0–10.5)

## 2015-02-21 MED ORDER — ASPIRIN 81 MG PO CHEW: 81.0000 mg | CHEWABLE_TABLET | Freq: Every day | ORAL | Status: AC

## 2015-02-21 MED ORDER — AMLODIPINE BESYLATE 5 MG PO TABS: 5.0000 mg | ORAL_TABLET | Freq: Every day | ORAL | Status: AC

## 2015-02-21 MED ORDER — SODIUM CHLORIDE 0.9 % IV BOLUS (SEPSIS)
500.0000 mL | Freq: Once | INTRAVENOUS | Status: AC
  Administered 2015-02-21: 500 mL via INTRAVENOUS

## 2015-02-21 MED ORDER — METOPROLOL TARTRATE 12.5 MG HALF TABLET
12.5000 mg | ORAL_TABLET | Freq: Two times a day (BID) | ORAL | Status: DC
  Filled 2015-02-21 (×2): qty 1

## 2015-02-21 MED ORDER — AMLODIPINE BESYLATE 5 MG PO TABS
5.0000 mg | ORAL_TABLET | Freq: Every day | ORAL | Status: DC
  Administered 2015-02-21: 5 mg via ORAL
  Filled 2015-02-21: qty 1

## 2015-02-21 NOTE — Progress Notes (Signed)
Subjective: Tachycardic on telemetry this AM. It appears that she becomes tachy into the 120's w/ some activity, appears sinus on telemetry. Asymptomatic. States she has had some diarrhea overnight w/ some small amount of blood. No dizziness, lightheadedness, nausea, vomiting, palpitations, or abdominal pain.    Objective: Vital signs in last 24 hours: Filed Vitals:   02/19/15 2124 02/20/15 0540 02/20/15 2143 02/21/15 0612  BP: 139/70 143/70 141/69 145/70  Pulse: 49 56  105  Temp: 97.9 F (36.6 C) 98.6 F (37 C) 98.6 F (37 C) 98.3 F (36.8 C)  TempSrc: Oral Oral Oral Oral  Resp: Height:      Weight:      SpO2: 99% 98% 99% 95%   Weight change:  No intake or output data in the 24 hours ending 02/21/15 1002  Physical Exam: General: Elderly white female, alert, cooperative, NAD. HEENT: PERRL, EOMI. Moist mucus membranes Neck: Full range of motion without pain, supple, no lymphadenopathy or carotid bruits Lungs: Clear to ascultation bilaterally, normal work of respiration, no wheezes, rales, rhonchi Heart: Mild tachycardia, no murmurs, gallops, or rubs Abdomen: Soft, non-tender, non-distended, BS + Extremities: No cyanosis, clubbing, or edema Neurologic: Alert & oriented x1, cranial nerves II-XII intact, strength grossly intact, sensation intact to light touch    Lab Results: Basic Metabolic Panel:  Recent Labs Lab 02/20/15 0616 02/21/15 0555  NA 139 138  K 3.6 4.0  CL 109 108  CO2 25 23  GLUCOSE 91 97  BUN 19 20  CREATININE 1.07* 1.26*  CALCIUM 8.6* 8.8*   Liver Function Tests:  Recent Labs Lab 02/19/15 1032  AST 20  ALT 14  ALKPHOS 83  BILITOT 0.5  PROT 6.4*  ALBUMIN 3.4*   CBC:  Recent Labs Lab 02/19/15 1630 02/20/15 0616  WBC 9.6 7.5  HGB 11.7* 11.3*  HCT 35.0* 34.2*  MCV 83.5 84.0  PLT 224 189   Coagulation:  Recent Labs Lab 02/19/15 1032  LABPROT 14.7  INR 1.13   Studies/Results: Dg Wrist Complete  Left  02/19/2015   CLINICAL DATA:  Syncope, fall, left wrist pain  EXAM: LEFT WRIST - COMPLETE 3+ VIEW  COMPARISON:  None.  FINDINGS: Four views of the left wrist submitted. No acute fracture or subluxation. Mild chondrocalcinosis. Mild narrowing of radiocarpal joint space. Degenerative changes first carpometacarpal joint.  IMPRESSION: No acute fracture or subluxation. Mild chondrocalcinosis. Narrowing of radiocarpal joint space. Degenerative changes first carpometacarpal joint.   Electronically Signed   By: Natasha Mead M.D.   On: 02/19/2015 10:51   Medications: I have reviewed the patient's current medications. Scheduled Meds: . aspirin  81 mg Oral Daily  . atorvastatin  20 mg Oral q1800  . escitalopram  10 mg Oral Daily  . pantoprazole  40 mg Oral Q0600  . sodium chloride  3 mL Intravenous Q12H   Continuous Infusions:  PRN Meds:.  Assessment/Plan: 69 y/o F w/ PMHX AAA s/p repair in 2015, h/o back surgery, and dementia, admitted for syncope and rectal bleeding.   Syncope: Etiology unclear, may be related to bradycardia vs vasovagal event. Seems likely that patient has been beta blocked, unable to compensate while standing given her bradycardia. Patient w/ bradycardia while at rest, increases to the 100's w/ activity. Patient asymptomatic. Denies dizziness, lightheadedness, or palpitations. No chest pain or SOB. Orthostatic on exam this AM.  -Give 500 cc bolus -HOLD further beta blocker -Can start Norvasc 5 mg daily for BP control.  Rectal bleeding: Hemoglobin stable, most likely related to hemorrhoids. Patient does note some loose stools w/ scant blood today. CBC trend as follows:   Recent Labs Lab 02/19/15 1630 02/20/15 0616 02/21/15 1050  HGB 11.7* 11.3* 11.1*  HCT 35.0* 34.2* 33.5*  WBC 9.6 7.5 8.1  PLT 224 189 201  -Defer colonoscopy for outpatient -Continue Protonix 40 mg daily  AKI: Cr slightly elevated from baseline today, still likely dry given orthostatic changes.   -Recheck BMP as an outpatient  Dementia: She is at her mental baseline per her son. -Continue Lexapro -Hold Aricept + Namenda until patient sees PCP given syncope  Hyperlipidemia: Continue Lipitor 40 mg.  Tobacco abuse: Consider nicotine replacement patch per patient request.  #FEN:  -Diet: Regular  #DVT prophylaxis: SCDs  #CODE STATUS: FULL CODE -Defer to [name], son and HCPOA [phone]] if patients lacks decision-making capacity -Confirmed with patient on admission  Dispo: Disposition is deferred at this time, awaiting improvement of current medical problems.   The patient does have a current PCP [name], MD) and does not need an [facility] hospital follow-up appointment after discharge.  The patient does have transportation limitations that hinder transportation to clinic appointments.  .Services Needed at time of discharge: Y = Yes, Blank = No PT:   OT:   RN:   Equipment:   Other:     LOS: 2 days   Courtney Paris, MD 02/21/2015, 10:02 AM

## 2015-02-21 NOTE — Progress Notes (Signed)
02/21/15 Patient being discharged home with son. Discharge instructions reviewed with family.

## 2015-02-21 NOTE — Discharge Instructions (Signed)
1. You have a follow up appointment as follows:  Briana Hunter, Briana Hunter  Schedule an appointment as soon as possible for a visit in 1 week  [address] [place] [address] [phone]  2. Please take all medications as previously prescribed with the following changes:  Stop taking Metoprolol for your blood pressure.   Start taking Norvasc 5 mg daily instead of the Metoprolol.  3. If you have worsening of your symptoms or new symptoms arise, please call the clinic ([phone]), or go to the ER immediately if symptoms are severe.   Syncope Syncope is a medical term for fainting or passing out. This means you lose consciousness and drop to the ground. People are generally unconscious for less than 5 minutes. You may have some muscle twitches for up to 15 seconds before waking up and returning to normal. Syncope occurs more often in older adults, but it can happen to anyone. While most causes of syncope are not dangerous, syncope can be a sign of a serious medical problem. It is important to seek medical care.  CAUSES  Syncope is caused by a sudden drop in blood flow to the brain. The specific cause is often not determined. Factors that can bring on syncope include:  Taking medicines that lower blood pressure.  Sudden changes in posture, such as standing up quickly.  Taking more medicine than prescribed.  Standing in one place for too long.  Seizure disorders.  Dehydration and excessive exposure to heat.  Low blood sugar (hypoglycemia).  Straining to have a bowel movement.  Heart disease, irregular heartbeat, or other circulatory problems.  Fear, emotional distress, seeing blood, or severe pain. SYMPTOMS  Right before fainting, you may:  Feel dizzy or light-headed.  Feel nauseous.  See all white or all black in your field of vision.  Have cold, clammy skin. DIAGNOSIS  Your health care provider will ask about your symptoms, perform a physical exam, and perform an  electrocardiogram (ECG) to record the electrical activity of your heart. Your health care provider may also perform other heart or blood tests to determine the cause of your syncope which may include:  Transthoracic echocardiogram (TTE). During echocardiography, sound waves are used to evaluate how blood flows through your heart.  Transesophageal echocardiogram (TEE).  Cardiac monitoring. This allows your health care provider to monitor your heart rate and rhythm in real time.  Holter monitor. This is a portable device that records your heartbeat and can help diagnose heart arrhythmias. It allows your health care provider to track your heart activity for several days, if needed.  Stress tests by exercise or by giving medicine that makes the heart beat faster. TREATMENT  In most cases, no treatment is needed. Depending on the cause of your syncope, your health care provider may recommend changing or stopping some of your medicines. HOME CARE INSTRUCTIONS  Have someone stay with you until you feel stable.  Do not drive, use machinery, or play sports until your health care provider says it is okay.  Keep all follow-up appointments as directed by your health care provider.  Lie down right away if you start feeling like you might faint. Breathe deeply and steadily. Wait until all the symptoms have passed.  Drink enough fluids to keep your urine clear or pale yellow.  If you are taking blood pressure or heart medicine, get up slowly and take several minutes to sit and then stand. This can reduce dizziness. SEEK IMMEDIATE MEDICAL CARE IF:   You  have a severe headache.  You have unusual pain in the chest, abdomen, or back.  You are bleeding from your mouth or rectum, or you have black or tarry stool.  You have an irregular or very fast heartbeat.  You have pain with breathing.  You have repeated fainting or seizure-like jerking during an episode.  You faint when sitting or lying  down.  You have confusion.  You have trouble walking.  You have severe weakness.  You have vision problems. If you fainted, call your local emergency services (911 in [place] Do not drive yourself to the hospital.  MAKE SURE YOU:  Understand these instructions.  Will watch your condition.  Will get help right away if you are not doing well or get worse. Document Released: 09/18/2005 Document Revised: 09/23/2013 Document Reviewed: 11/17/2011 [facility] Patient Information 2015 [place], [place]. This information is not intended to replace advice given to you by your health care provider. Make sure you discuss any questions you have with your health care provider.

## 2015-02-22 LAB — HEMOGLOBIN A1C
HEMOGLOBIN A1C: 5.8 % — AB (ref 4.8–5.6)
Mean Plasma Glucose: 120 mg/dL

## 2015-03-03 DIAGNOSIS — R55 Syncope and collapse: Secondary | ICD-10-CM | POA: Diagnosis not present

## 2015-10-19 DIAGNOSIS — R0682 Tachypnea, not elsewhere classified: Secondary | ICD-10-CM | POA: Diagnosis not present

## 2015-10-19 DIAGNOSIS — R52 Pain, unspecified: Secondary | ICD-10-CM | POA: Diagnosis not present

## 2015-10-19 DIAGNOSIS — S12112A Nondisplaced Type II dens fracture, initial encounter for closed fracture: Secondary | ICD-10-CM | POA: Diagnosis not present

## 2015-10-19 DIAGNOSIS — S129XXA Fracture of neck, unspecified, initial encounter: Secondary | ICD-10-CM | POA: Diagnosis not present

## 2015-10-19 DIAGNOSIS — M542 Cervicalgia: Secondary | ICD-10-CM | POA: Diagnosis not present

## 2015-10-19 DIAGNOSIS — H9209 Otalgia, unspecified ear: Secondary | ICD-10-CM | POA: Diagnosis not present

## 2015-10-27 ENCOUNTER — Ambulatory Visit (HOSPITAL_COMMUNITY): Payer: Medicare Other

## 2015-10-27 ENCOUNTER — Emergency Department (HOSPITAL_COMMUNITY): Payer: Medicare Other

## 2015-10-27 ENCOUNTER — Emergency Department (HOSPITAL_COMMUNITY)
Admission: EM | Admit: 2015-10-27 | Discharge: 2015-10-27 | Disposition: A | Discharge status: Home / Self Care | Payer: Medicare Other | Attending: Emergency Medicine | Admitting: Emergency Medicine

## 2015-10-27 ENCOUNTER — Encounter (HOSPITAL_COMMUNITY): Payer: Self-pay

## 2015-10-27 DIAGNOSIS — Z79899 Other long term (current) drug therapy: Secondary | ICD-10-CM | POA: Insufficient documentation

## 2015-10-27 DIAGNOSIS — F0281 Dementia in other diseases classified elsewhere with behavioral disturbance: Secondary | ICD-10-CM | POA: Insufficient documentation

## 2015-10-27 DIAGNOSIS — Z8679 Personal history of other diseases of the circulatory system: Secondary | ICD-10-CM | POA: Insufficient documentation

## 2015-10-27 DIAGNOSIS — Z008 Encounter for other general examination: Secondary | ICD-10-CM | POA: Diagnosis not present

## 2015-10-27 DIAGNOSIS — Z8739 Personal history of other diseases of the musculoskeletal system and connective tissue: Secondary | ICD-10-CM | POA: Diagnosis not present

## 2015-10-27 DIAGNOSIS — F0391 Unspecified dementia with behavioral disturbance: Secondary | ICD-10-CM | POA: Diagnosis not present

## 2015-10-27 DIAGNOSIS — Z8781 Personal history of (healed) traumatic fracture: Secondary | ICD-10-CM | POA: Diagnosis not present

## 2015-10-27 DIAGNOSIS — G309 Alzheimer's disease, unspecified: Secondary | ICD-10-CM | POA: Insufficient documentation

## 2015-10-27 DIAGNOSIS — E7800 Pure hypercholesterolemia, unspecified: Secondary | ICD-10-CM | POA: Diagnosis not present

## 2015-10-27 DIAGNOSIS — R4182 Altered mental status, unspecified: Secondary | ICD-10-CM | POA: Diagnosis not present

## 2015-10-27 DIAGNOSIS — F17200 Nicotine dependence, unspecified, uncomplicated: Secondary | ICD-10-CM | POA: Diagnosis not present

## 2015-10-27 LAB — COMPREHENSIVE METABOLIC PANEL
ALK PHOS: 103 U/L (ref 38–126)
ALT: 12 U/L — AB (ref 14–54)
AST: 17 U/L (ref 15–41)
Albumin: 3.9 g/dL (ref 3.5–5.0)
Anion gap: 9 (ref 5–15)
BUN: 31 mg/dL — AB (ref 6–20)
CALCIUM: 9.2 mg/dL (ref 8.9–10.3)
CO2: 23 mmol/L (ref 22–32)
CREATININE: 0.97 mg/dL (ref 0.44–1.00)
Chloride: 106 mmol/L (ref 101–111)
GFR, EST NON AFRICAN AMERICAN: 57 mL/min — AB (ref >60)
Glucose, Bld: 105 mg/dL — ABNORMAL HIGH (ref 65–99)
Potassium: 4.1 mmol/L (ref 3.5–5.1)
Sodium: 138 mmol/L (ref 135–145)
Total Bilirubin: 0.5 mg/dL (ref 0.3–1.2)
Total Protein: 7.6 g/dL (ref 6.5–8.1)

## 2015-10-27 LAB — ETHANOL

## 2015-10-27 LAB — URINALYSIS, ROUTINE W REFLEX MICROSCOPIC
Bilirubin Urine: NEGATIVE
Glucose, UA: NEGATIVE mg/dL
Ketones, ur: NEGATIVE mg/dL
Nitrite: NEGATIVE
PROTEIN: NEGATIVE mg/dL
Specific Gravity, Urine: 1.018 (ref 1.005–1.030)
pH: 5 (ref 5.0–8.0)

## 2015-10-27 LAB — URINE MICROSCOPIC-ADD ON

## 2015-10-27 LAB — RAPID URINE DRUG SCREEN, HOSP PERFORMED
Amphetamines: NOT DETECTED
BARBITURATES: NOT DETECTED
Benzodiazepines: NOT DETECTED
Cocaine: NOT DETECTED
Opiates: NOT DETECTED
Tetrahydrocannabinol: NOT DETECTED

## 2015-10-27 LAB — CBC
HCT: 40.7 % (ref 36.0–46.0)
Hemoglobin: 13.5 g/dL (ref 12.0–15.0)
MCH: 30.1 pg (ref 26.0–34.0)
MCHC: 33.2 g/dL (ref 30.0–36.0)
MCV: 90.6 fL (ref 78.0–100.0)
PLATELETS: 306 10*3/uL (ref 150–400)
RBC: 4.49 MIL/uL (ref 3.87–5.11)
RDW: 13 % (ref 11.5–15.5)
WBC: 8.4 10*3/uL (ref 4.0–10.5)

## 2015-10-27 LAB — SALICYLATE LEVEL

## 2015-10-27 LAB — ACETAMINOPHEN LEVEL

## 2015-10-27 NOTE — ED Notes (Signed)
Pt's son reports that they have to leave at 1600.  He's made aware that if they left with pt, they would have to check back in and start over.  He was given the option to have one person stay with her and the other leave or leave pt in the ED to see the EDP.

## 2015-10-27 NOTE — Progress Notes (Signed)
70 yr old female from [place] has lived in [place].  Pt presently living with son, Briana Hunter for "about a year and three months" after son called her, noted concerns and brought her to live with him.   Cm went to see pt. Noted pt in bed with neck brace.  CM went back to ED RN to verify correct pt.  CM met ED SW, son and daughter in law in ED conference room.  CM along with SW asked questions.  Son confirmed Dr Metta Clines saw pt 4 times and released her stating her concerns he felt were "behavioral"  Confirms no dx of dementia and pt was placed on Lexapro now for about "nine months" and "andy" started lexapro.   Cm discussed call to Dr Rochel Brome office and gail confirming pt is not active pt.  Daughter in law states that Dr Elnita Maxwell is the family dr for "the family" and Daughter in law has been speaking with "andy cox the PA" Confirms pt goes to [facility] (where Daughter in law has worked)  Cm did leave SW with son and daughter in law to re visit the pt.  Pt is alert to person and partial situation Pt able to tell CM her full name and states she knows she is at a hospital and was brought by her son "Briana Hunter" but does not know the name of the hospital

## 2015-10-27 NOTE — Progress Notes (Signed)
ED CM spoke with EDP, Zackowski to get clarity on cm consult: Elderly behavior -admit Discussed no available HH BH RN ? BH admission or BH d/c plans -TTS consult also noted  1547 CM called pt pcp office kirsten cox [address], [place], [place] [address]  Phone: [phone]  1542 Dondra Spry in the office confirms this pt is no longer a pt of Dr Sedalia Muta and is being seen by a Dr at  [facility] [address] [place], [place] [address] [phone] or [phone]  Neurologist for this pt is Casimiro Needle applegate at [phone]  States pt last seen by Dr Sedalia Muta in June of 2016   1553 Loraine Leriche at [facility] [phone] states pt is not active at their facility States none of the patients they have are named Zielinski  1558 cm called [facility]: Isa Rankin MD [address] [place], [place] [address] [phone] CM LVM

## 2015-10-27 NOTE — Progress Notes (Signed)
CSW met with patient at bedside. Son and daughter in law were present. Patient presents to [facility] due to altered mental status. When CSW asked patient what brings her to [facility], patient stated " I dont know". 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  This patient lives in [place].  Patient confirms that she comes from home and that she lives with her son and his family. Family states the patient has been living with them for 1 year and 3 months. Family states that the patient is currently enrolled in a day program ( [facility] ). The patient goes to " [facility]" and has been a client there for 6 months.  CSW asked questions to get an idea patient's capacity. Patient was not able to answer questions relating to present time and day. Patient was unable to tell CSW who was the president of the [place], current month, year, or that she was in a hospital setting. However, patient was able to state her birth date and recall who her son and daughter and law are.    Patient states she can complete all ADL's independently. Patient denies falling often. Patient walks with no equipment. Also, patient denies SI, HI, AVH. CSW provided patient with a list of ALF, patient  Accepted. Patient states that she would be open to living at an assisted living. Patient states she feels as though she has good support.   Son informed CSW that prior to patient moving to [place] to stay with him, she lived in [place]. Son states he would speak with patient over the phone and notice that she would sound "Weird". Son states that patient would also abuse her medications in the past. Son states that patient has spinal surgery about 25 years ago and was taking different narcotics.  Son informed CSW that patient has went to a neurologist in the past. Son states that the patient's neurologist is Dr.Michael Applegate. According to son, patient was seen at the neurologist office x4. Son states  neurologist determined that the patient did not have any cognitive issues, or anything suggesting she has dementia. Son states neurologist felt that patient may have behavioral/mental health issues and it was not anything that he would be able to assist patient with.  Patient and family state that they do not have any questions for CSW at this time.  Willette Brace [phone] ED CSW 10/27/2015 6:12 PM

## 2015-10-27 NOTE — Progress Notes (Signed)
ED CM went to offer pt and family there following resources but was informed by ED RN/NA that they requested to leave because there was "someone at their house" Resources  [employer] [phone] List of [facility] medic[facility] psychiatrist and internal medicine providers within zip [phone] [employer] [phone]  [facility] referral based on pt returning to see [name]  [facility] contact information [phone] plus flyer describing program  Private duty nursing list Son had stated he was not able to afford "a nurse coming in"  CM reviewed in details medicare guidelines, home health [facility]) (length of stay in home, types of [facility] staff available, coverage, primary caregiver, up to 24 hrs before services may be started) and Private duty nursing (PDN-coverage, length of stay in the home types of staff available). CM reviewed availability of HH SW to assist pcp to get pt to snf (if desired disposition) from the community level.  Pt not meeting home health criteria she is ambulating independent and doing all ADLs, Pcp not available since June 2016 Pt is not homebound

## 2015-10-27 NOTE — ED Notes (Signed)
SW speaking to family

## 2015-10-27 NOTE — ED Notes (Signed)
Family refused head CT.  States pt had one done last week.

## 2015-10-27 NOTE — ED Provider Notes (Signed)
CSN: [id number]     Arrival date & time 10/27/15  1155 History   First MD Initiated Contact with Patient 10/27/15 [phone]     Chief Complaint  Patient presents with  . Altered Mental Status  . Medical Clearance     (Consider location/radiation/quality/duration/timing/severity/associated sxs/prior Treatment) The history is provided by the patient and a relative.   70 year old female brought in by family members. Patient's been having trouble with the confusion behavioral problems for the past year been getting worse of late. Patient been evaluated by neurology with workup without any evidence of Alzheimer's. Patient was referred here by primary care provider in the [place] area for psychiatric evaluation. No concerns for homicidal or suicidal ideations. Patient is uncooperative at home at times. Recently had a fall with a cervical fracture odontoid level. In hard collar patient frequently takes it off. Patient's also taking her medications appropriately.  Patient here is alert and does not really answer questions appropriately or follow commands. But is in no acute distress.  Past Medical History  Diagnosis Date  . AAA (abdominal aortic aneurysm) (HCC)   . Hypercholesteremia   . DDD (degenerative disc disease), thoracolumbar    Past Surgical History  Procedure Laterality Date  . Abdominal aortic aneurysm repair    . Appendectomy    . Tonsillectomy    . Low back surgery      mutiple levels, hardware fixation.   . Abdominal hysterectomy    . Uterine fibroid surgery     History reviewed. No pertinent family history. Social History  Substance Use Topics  . Smoking status: Current Every Day Smoker -- 0.50 packs/day  . Smokeless tobacco: None  . Alcohol Use: No   OB History    No data available     Review of Systems  Unable to perform ROS: Dementia      Allergies  Review of patient's allergies indicates no known allergies.  Home Medications   Prior to Admission  medications   Medication Sig Start Date End Date Taking? Authorizing Provider  atorvastatin (LIPITOR) 20 MG tablet Take 20 mg by mouth at bedtime.    Yes Historical Provider, MD  escitalopram (LEXAPRO) 10 MG tablet Take 10 mg by mouth at bedtime.    Yes Historical Provider, MD  amLODipine (NORVASC) 5 MG tablet Take 1 tablet (5 mg total) by mouth daily. Patient not taking: Reported on 10/27/2015 02/21/15   Courtney Paris, MD  aspirin 81 MG chewable tablet Chew 1 tablet (81 mg total) by mouth daily. Patient not taking: Reported on 10/27/2015 02/21/15   Courtney Paris, MD   BP 160/82 mmHg  Pulse 68  Temp(Src) 97.8 F (36.6 C) (Oral)  Resp 14  SpO2 100% Physical Exam  Constitutional: She appears well-developed and well-nourished. No distress.  HENT:  Head: Normocephalic and atraumatic.  Mouth/Throat: Oropharynx is clear and moist.  Eyes: Conjunctivae and EOM are normal. Pupils are equal, round, and reactive to light.  Neck:  Hard cervical collar on place  Cardiovascular: Normal rate, regular rhythm and normal heart sounds.   No murmur heard. Pulmonary/Chest: Effort normal and breath sounds normal. No respiratory distress.  Abdominal: Soft. Bowel sounds are normal. There is no tenderness.  Musculoskeletal: Normal range of motion.  Neurological: She is alert. No cranial nerve deficit. She exhibits normal muscle tone. Coordination normal.  Nursing note and vitals reviewed.   ED Course  Procedures (including critical care time) Labs Review Labs Reviewed  COMPREHENSIVE METABOLIC PANEL - Abnormal; Notable  for the following:    Glucose, Bld 105 (*)    BUN 31 (*)    ALT 12 (*)    GFR calc non Af Amer 57 (*)    All other components within normal limits  URINALYSIS, ROUTINE W REFLEX MICROSCOPIC (NOT AT [facility]) - Abnormal; Notable for the following:    Hgb urine dipstick MODERATE (*)    Leukocytes, UA MODERATE (*)    All other components within normal limits  ACETAMINOPHEN LEVEL - Abnormal;  Notable for the following:    Acetaminophen (Tylenol), Serum <10 (*)    All other components within normal limits  URINE MICROSCOPIC-ADD ON - Abnormal; Notable for the following:    Squamous Epithelial / LPF 6-30 (*)    Bacteria, UA RARE (*)    All other components within normal limits  CBC  ETHANOL  URINE RAPID DRUG SCREEN, HOSP PERFORMED  SALICYLATE LEVEL   Results for orders placed or performed during the hospital encounter of 10/27/15  Comprehensive metabolic panel  Result Value Ref Range   Sodium 138 135 - 145 mmol/L   Potassium 4.1 3.5 - 5.1 mmol/L   Chloride 106 101 - 111 mmol/L   CO2 23 22 - 32 mmol/L   Glucose, Bld 105 (H) 65 - 99 mg/dL   BUN 31 (H) 6 - 20 mg/dL   Creatinine, Ser 1.61 0.44 - 1.00 mg/dL   Calcium 9.2 8.9 - 09.6 mg/dL   Total Protein 7.6 6.5 - 8.1 g/dL   Albumin 3.9 3.5 - 5.0 g/dL   AST 17 15 - 41 U/L   ALT 12 (L) 14 - 54 U/L   Alkaline Phosphatase 103 38 - 126 U/L   Total Bilirubin 0.5 0.3 - 1.2 mg/dL   GFR calc non Af Amer 57 (L) >60 mL/min   GFR calc Af Amer >60 >60 mL/min   Anion gap 9 5 - 15  CBC  Result Value Ref Range   WBC 8.4 4.0 - 10.5 K/uL   RBC 4.49 3.87 - 5.11 MIL/uL   Hemoglobin 13.5 12.0 - 15.0 g/dL   HCT 04.5 40.9 - 81.1 %   MCV 90.6 78.0 - 100.0 fL   MCH 30.1 26.0 - 34.0 pg   MCHC 33.2 30.0 - 36.0 g/dL   RDW 91.4 78.2 - 95.6 %   Platelets 306 150 - 400 K/uL  Urinalysis, Routine w reflex microscopic (not at [facility])  Result Value Ref Range   Color, Urine YELLOW YELLOW   APPearance CLEAR CLEAR   Specific Gravity, Urine 1.018 1.005 - 1.030   pH 5.0 5.0 - 8.0   Glucose, UA NEGATIVE NEGATIVE mg/dL   Hgb urine dipstick MODERATE (A) NEGATIVE   Bilirubin Urine NEGATIVE NEGATIVE   Ketones, ur NEGATIVE NEGATIVE mg/dL   Protein, ur NEGATIVE NEGATIVE mg/dL   Nitrite NEGATIVE NEGATIVE   Leukocytes, UA MODERATE (A) NEGATIVE  Ethanol  Result Value Ref Range   Alcohol, Ethyl (B) <5 <5 mg/dL  Urine rapid drug screen (hosp performed)   Result Value Ref Range   Opiates NONE DETECTED NONE DETECTED   Cocaine NONE DETECTED NONE DETECTED   Benzodiazepines NONE DETECTED NONE DETECTED   Amphetamines NONE DETECTED NONE DETECTED   Tetrahydrocannabinol NONE DETECTED NONE DETECTED   Barbiturates NONE DETECTED NONE DETECTED  Acetaminophen level  Result Value Ref Range   Acetaminophen (Tylenol), Serum <10 (L) 10 - 30 ug/mL  Salicylate level  Result Value Ref Range   Salicylate Lvl <4.0 2.8 - 30.0 mg/dL  Urine microscopic-add on  Result Value Ref Range   Squamous Epithelial / LPF 6-30 (A) NONE SEEN   WBC, UA 6-30 0 - 5 WBC/hpf   RBC / HPF 0-5 0 - 5 RBC/hpf   Bacteria, UA RARE (A) NONE SEEN   Urine-Other MUCOUS PRESENT      Imaging Review Dg Chest 2 View  10/27/2015  CLINICAL DATA:  Altered mental status. EXAM: CHEST  2 VIEW COMPARISON:  08/31/2014 FINDINGS: The heart size and mediastinal contours are within normal limits. Both lungs are clear. The visualized skeletal structures are unremarkable. IMPRESSION: No active cardiopulmonary disease. Electronically Signed   By: Elige Ko   On: 10/27/2015 15:47   I have personally reviewed and evaluated these images and lab results as part of my medical decision-making.   EKG Interpretation   Date/Time:  Wednesday October 27 2015 15:33:12 EST Ventricular Rate:  67 PR Interval:  183 QRS Duration: 86 QT Interval:  413 QTC Calculation: 436 R Axis:   66 Text Interpretation:  Sinus rhythm Nonspecific T abnormalities, anterior  leads Baseline wander in lead(s) I No significant change since last  tracing Confirmed by Chaselyn Nanney  MD, Haidan Nhan [phone]) on 10/27/2015 4:44:27 PM  Also confirmed by Deretha Emory  MD, Margan Elias [phone])  on 10/27/2015 5:20:02 PM      MDM   Final diagnoses:  Dementia, with behavioral disturbance    Patient brought in by family members with concerns for psychiatric cause for patient's behavioral health problems. Evaluation seems to be more consistent with a  senile dementia with behavioral problems. Patient without a past diagnosis of any psychiatric problems. Symptoms have been ongoing for a year but getting worse lately. Patient with a recent fall resulting in a odontoid fracture patient in cervical collar for that. Family struggling to take care of her at home. Medical workup here without any acute findings chest x-rays negative for pneumonia pneumothorax. EKG without any acute cardiac changes labs without any significant abnormalities. Urinalysis seems to be contaminated no evidence urinary tract infection. Had wanted to do CT head but family wants to go home and that'll be done by primary care provider. Patient seen by case manager and behavioral health and they gave resource information to help her in the [facility] area.  According to family patient was seen by neurology in the last 6 months and did have an MRI without any significant findings.      Vanetta Mulders, MD 10/27/15 [phone]

## 2015-10-27 NOTE — Discharge Instructions (Signed)
Follow up with the resources provided by case manager and behavioral health. Return for any new or worse symptoms or as needed.

## 2015-10-27 NOTE — ED Notes (Addendum)
Pt has been living with son and family for 1 1/2 months.  Family states pt was abusing meds and taking off of many of them.  Was seen by neurology for her behavior and it did not improve.  MRI and additional test were done.  Family states pt is attention seeking.  Playing games with them.  Pt does not answer questions appropriately in triage.  Family states this is not new and has been this way.  MD believes it has been behavioral.  No dementia, alzhiemers or psych dx.  Takes antidepressant meds.  When speaking of why patient is here, pt remains looking looking forward and not giving feedback.  Pt in neck collar from injury x 1 week.  Family denies drugs and family controls all personal care and meds.

## 2015-10-27 NOTE — ED Notes (Signed)
Pt's family reports pt has been "popping pills," is not sleeping and have been having irrational behavior.  Family reports pt have been in to see her PCP and were instructed to take pt to the ED for a psych eval.  Pt is alert and oriented x 2.   Would only answer certain questions correctly.  Pt is calm and cooperative at this time.  Family also reports that they have to constantly monitor pt, even when she is using the BR she wouldn't wash her hands.  Family reports pt goes to a "day care" during the day and have fallen twice now since.  She states "she does this to get attention."  c-collar noted.

## 2015-10-27 NOTE — BH Assessment (Signed)
Assessment Note  Briana Hunter is an 70 y.o. female presenting to [facility] with altered mental status. Patient living with son for the past 1 1/2 year. Patient reportedly moved from [place]. Since living with son patient has become increasingly confused. . Patient been evaluated by neurology with workup without any evidence of Alzheimer's. Today patient is oriented x2, She is calm and cooperative. Patient has a history of depression. She denies SI, HI, and AVH's. Patient does not have a current outpatient psychiatrist or therapist. She does not have a history of inpatient psychiatric treatment.   Diagnosis: Depressive Disorder  Past Medical History:  Past Medical History  Diagnosis Date  . AAA (abdominal aortic aneurysm) (HCC)   . Hypercholesteremia   . DDD (degenerative disc disease), thoracolumbar     Past Surgical History  Procedure Laterality Date  . Abdominal aortic aneurysm repair    . Appendectomy    . Tonsillectomy    . Low back surgery      mutiple levels, hardware fixation.   . Abdominal hysterectomy    . Uterine fibroid surgery      Family History: History reviewed. No pertinent family history.  Social History:  reports that she has been smoking.  She does not have any smokeless tobacco history on file. She reports that she does not drink alcohol or use illicit drugs.  Additional Social History:  Alcohol / Drug Use Pain Medications: SEE MAR Prescriptions: SEE MAR Over the Counter: SEE MAR  History of alcohol / drug use?: No history of alcohol / drug abuse  CIWA: CIWA-Ar BP: 160/82 mmHg Pulse Rate: 68 COWS:    Allergies: No Known Allergies  Home Medications:  (Not in a hospital admission)  OB/GYN Status:  No LMP recorded. Patient is postmenopausal.  General Assessment Data Location of Assessment: [facility] TTS Assessment: In system Is this a Tele or Face-to-Face Assessment?: Face-to-Face Is this an Initial Assessment or a Re-assessment for this encounter?:  Initial Assessment Marital status: Single Maiden name:  (n/a) Is patient pregnant?: No Pregnancy Status: No Living Arrangements: Other (Comment) (patient lives with son, daughte in law, and grandchilrenn) Can pt return to current living arrangement?: Yes Admission Status: Voluntary Is patient capable of signing voluntary admission?: Yes Referral Source: Self/Family/Friend Insurance type:  ([employer] [facility])     Crisis Care Plan Living Arrangements: Other (Comment) (patient lives with son, daughte in Social worker, and Advertising copywriter) Legal Guardian:  (denies ) Name of Psychiatrist:  (No psychiatrist ) Name of Therapist:  (No therapist )  Education Status Is patient currently in school?: No Current Grade:  (n/a) Highest grade of school patient has completed:  (n/a) Name of school:  (n/a) Contact person:  (n/a)  Risk to self with the past 6 months Suicidal Ideation: No Has patient been a risk to self within the past 6 months prior to admission? : No Suicidal Intent: No Has patient had any suicidal intent within the past 6 months prior to admission? : No Is patient at risk for suicide?: No Suicidal Plan?: No Has patient had any suicidal plan within the past 6 months prior to admission? : No Access to Means: No What has been your use of drugs/alcohol within the last 12 months?:  (n/a) Previous Attempts/Gestures: No How many times?:  (0) Other Self Harm Risks:  (none reported) Triggers for Past Attempts:  (n/a) Intentional Self Injurious Behavior: None Family Suicide History: No Recent stressful life event(s):  (patient denies ) Persecutory voices/beliefs?: No Depression: No Depression Symptoms:  (  n/a) Substance abuse history and/or treatment for substance abuse?: No Suicide prevention information given to non-admitted patients: Not applicable  Risk to Others within the past 6 months Homicidal Ideation: No Does patient have any lifetime risk of violence toward others beyond the six  months prior to admission? : No Thoughts of Harm to Others: No Current Homicidal Intent: No Current Homicidal Plan: No Access to Homicidal Means: No Identified Victim: n/a History of harm to others?: No Assessment of Violence: None Noted Violent Behavior Description:  (patient calm and cooperative ) Does patient have access to weapons?: No Criminal Charges Pending?: No Does patient have a court date: No Is patient on probation?: No  Psychosis Hallucinations: None noted Delusions: None noted  Mental Status Report Appearance/Hygiene: Unremarkable Eye Contact: Good Motor Activity: Freedom of movement Speech: Logical/coherent Level of Consciousness: Alert Mood: Depressed (UTA) Affect: Unable to Assess Anxiety Level:  (n/a) Thought Processes: Unable to Assess Judgement: Unable to Assess Orientation: Unable to assess Obsessive Compulsive Thoughts/Behaviors: Unable to Assess  Cognitive Functioning Concentration: Decreased Memory: Recent Impaired, Remote Impaired IQ: Average Insight: Unable to Assess Impulse Control: Unable to Assess Appetite: Poor Weight Loss:  (unk) Weight Gain:  (unk) Sleep: Unable to Assess Total Hours of Sleep:  (unk) Vegetative Symptoms: Unable to Assess  ADLScreening [facility] Assessment Services) Patient's cognitive ability adequate to safely complete daily activities?: Yes Patient able to express need for assistance with ADLs?: Yes Independently performs ADLs?: Yes (appropriate for developmental age)  Prior Inpatient Therapy Prior Inpatient Therapy: No Prior Therapy Dates:  (n/a) Prior Therapy Facilty/Provider(s):  (n/a) Reason for Treatment:  (n/a)  Prior Outpatient Therapy Prior Outpatient Therapy: No Prior Therapy Dates:  (n/a) Prior Therapy Facilty/Provider(s):  (n/a) Reason for Treatment:  (n/a) Does patient have an ACCT team?: No Does patient have Intensive In-House Services?  : No Does patient have [facility] services? : No Does patient  have P4CC services?: No  ADL Screening (condition at time of admission) Patient's cognitive ability adequate to safely complete daily activities?: Yes Is the patient deaf or have difficulty hearing?: No Does the patient have difficulty seeing, even when wearing glasses/contacts?: No Does the patient have difficulty concentrating, remembering, or making decisions?: Yes Patient able to express need for assistance with ADLs?: Yes Does the patient have difficulty dressing or bathing?: No Independently performs ADLs?: Yes (appropriate for developmental age) Does the patient have difficulty walking or climbing stairs?: No Weakness of Legs: None Weakness of Arms/Hands: None  Home Assistive Devices/Equipment Home Assistive Devices/Equipment: None    Abuse/Neglect Assessment (Assessment to be complete while patient is alone) Physical Abuse: Denies Verbal Abuse: Denies Sexual Abuse: Denies Exploitation of patient/patient's resources: Denies Self-Neglect: Denies Values / Beliefs Cultural Requests During Hospitalization: None Spiritual Requests During Hospitalization: None   Advance Directives (For Healthcare) Does patient have an advance directive?: No Would patient like information on creating an advanced directive?: No - patient declined information    Additional Information 1:1 In Past 12 Months?: No CIRT Risk: No Elopement Risk: No Does patient have medical clearance?: Yes     Disposition:  Disposition Initial Assessment Completed for this Encounter: Yes Disposition of Patient: Other dispositions, Outpatient treatment (Patient does not meet criteria for inpatient treatment)  On Site Evaluation by:   Reviewed with Physician:    Melynda Ripple [facility] 10/27/2015 6:15 PM

## 2015-11-01 ENCOUNTER — Other Ambulatory Visit: Payer: Self-pay | Admitting: *Deleted

## 2015-11-01 NOTE — Patient Outreach (Signed)
[facility]) Care Management  11/01/2015  Briana Hunter [birth year] [id number]   [facility] referral received, unsuccessful outreach call to patient, HIPPA compliant message left with my call back number.  Plan Await return call, attempt outreach call in the next 3 days.   Egbert Garibaldi, RN, [facility] Care Management [phone]- Mobile [phone]- Toll Free Main Office

## 2015-11-02 DIAGNOSIS — E041 Nontoxic single thyroid nodule: Secondary | ICD-10-CM | POA: Diagnosis not present

## 2015-11-02 DIAGNOSIS — S12112A Nondisplaced Type II dens fracture, initial encounter for closed fracture: Secondary | ICD-10-CM | POA: Diagnosis not present

## 2015-11-03 DIAGNOSIS — S12100A Unspecified displaced fracture of second cervical vertebra, initial encounter for closed fracture: Secondary | ICD-10-CM | POA: Diagnosis not present

## 2015-11-09 ENCOUNTER — Other Ambulatory Visit: Payer: Self-pay | Admitting: *Deleted

## 2015-11-09 NOTE — Patient Outreach (Signed)
[facility]) Care Management  11/09/2015  Briana Hunter 1945-11-25 [id number]  Order for [facility] care management services. Telephone call to Joseph Berkshire  Unsuccessful, no answer, I left a HIPAA compliant telephone message with my call by number.   Plan Will attempt telephone call to patient again within 3 days. Egbert Garibaldi, RN, [facility] Care Management [phone]- Mobile [phone]- Toll Free Main Office

## 2015-11-10 DIAGNOSIS — E041 Nontoxic single thyroid nodule: Secondary | ICD-10-CM | POA: Diagnosis not present

## 2015-11-12 ENCOUNTER — Other Ambulatory Visit: Payer: Self-pay | Admitting: *Deleted

## 2015-11-12 ENCOUNTER — Encounter: Payer: Self-pay | Admitting: *Deleted

## 2015-11-12 NOTE — Patient Outreach (Signed)
[facility]) Care Management  11/12/2015  Briana Hunter 1946-02-09 [id number]   3rd unsuccessful attempt to contact patient regarding [facility] care management services, I left a HIPPA compliant message with my call back telephone number.  Plan If no return call back received, I will  Send outreach letter, if not response in 10 days , I will close case per protocol.   Egbert Garibaldi, RN, [facility] Care Management [phone]- Mobile [phone]- Toll Free Main Office

## 2015-11-17 DIAGNOSIS — E041 Nontoxic single thyroid nodule: Secondary | ICD-10-CM | POA: Diagnosis not present

## 2015-11-24 ENCOUNTER — Encounter: Payer: Self-pay | Admitting: *Deleted

## 2015-11-24 ENCOUNTER — Other Ambulatory Visit: Payer: Self-pay | Admitting: *Deleted

## 2015-11-24 NOTE — Patient Outreach (Signed)
[facility]) Care Management  11/24/2015  Briana Hunter [birth year] [id number]   RNCM has attempted 3 unsuccessful outreach calls to patient and no response. Outreach letter has been sent , no return response after 10 days..   Plan Will close case send CMA notification I will send MD notification   Egbert Garibaldi, RN, [facility] Care Management [phone]- Mobile [phone]- Toll Free Main Office

## 2016-02-16 DIAGNOSIS — S12100A Unspecified displaced fracture of second cervical vertebra, initial encounter for closed fracture: Secondary | ICD-10-CM | POA: Diagnosis not present

## 2016-03-29 DIAGNOSIS — S12100A Unspecified displaced fracture of second cervical vertebra, initial encounter for closed fracture: Secondary | ICD-10-CM | POA: Diagnosis not present

## 2016-04-12 DIAGNOSIS — R7301 Impaired fasting glucose: Secondary | ICD-10-CM | POA: Diagnosis not present

## 2016-04-12 DIAGNOSIS — K219 Gastro-esophageal reflux disease without esophagitis: Secondary | ICD-10-CM | POA: Diagnosis not present

## 2016-04-12 DIAGNOSIS — Z0000 Encounter for general adult medical examination without abnormal findings: Secondary | ICD-10-CM | POA: Diagnosis not present

## 2016-04-12 DIAGNOSIS — Z6824 Body mass index (BMI) 24.0-24.9, adult: Secondary | ICD-10-CM | POA: Diagnosis not present

## 2016-04-12 DIAGNOSIS — E041 Nontoxic single thyroid nodule: Secondary | ICD-10-CM | POA: Diagnosis not present

## 2016-05-29 DIAGNOSIS — S12100A Unspecified displaced fracture of second cervical vertebra, initial encounter for closed fracture: Secondary | ICD-10-CM | POA: Diagnosis not present

## 2016-05-29 DIAGNOSIS — S12100D Unspecified displaced fracture of second cervical vertebra, subsequent encounter for fracture with routine healing: Secondary | ICD-10-CM | POA: Diagnosis not present

## 2016-07-03 DIAGNOSIS — S12100G Unspecified displaced fracture of second cervical vertebra, subsequent encounter for fracture with delayed healing: Secondary | ICD-10-CM | POA: Diagnosis not present

## 2016-07-03 DIAGNOSIS — E041 Nontoxic single thyroid nodule: Secondary | ICD-10-CM | POA: Diagnosis not present

## 2016-07-03 DIAGNOSIS — S12100A Unspecified displaced fracture of second cervical vertebra, initial encounter for closed fracture: Secondary | ICD-10-CM | POA: Diagnosis not present

## 2016-07-03 DIAGNOSIS — S12110G Anterior displaced Type II dens fracture, subsequent encounter for fracture with delayed healing: Secondary | ICD-10-CM | POA: Diagnosis not present

## 2016-07-19 DIAGNOSIS — I252 Old myocardial infarction: Secondary | ICD-10-CM | POA: Diagnosis not present

## 2016-07-19 DIAGNOSIS — I517 Cardiomegaly: Secondary | ICD-10-CM | POA: Diagnosis not present

## 2016-07-19 DIAGNOSIS — I10 Essential (primary) hypertension: Secondary | ICD-10-CM | POA: Diagnosis not present

## 2016-07-19 DIAGNOSIS — Z01810 Encounter for preprocedural cardiovascular examination: Secondary | ICD-10-CM | POA: Diagnosis not present

## 2016-07-19 DIAGNOSIS — Z01818 Encounter for other preprocedural examination: Secondary | ICD-10-CM | POA: Diagnosis not present

## 2016-07-19 DIAGNOSIS — S12110G Anterior displaced Type II dens fracture, subsequent encounter for fracture with delayed healing: Secondary | ICD-10-CM | POA: Diagnosis not present

## 2016-08-02 DIAGNOSIS — I10 Essential (primary) hypertension: Secondary | ICD-10-CM | POA: Diagnosis not present

## 2016-08-02 DIAGNOSIS — S12110G Anterior displaced Type II dens fracture, subsequent encounter for fracture with delayed healing: Secondary | ICD-10-CM | POA: Diagnosis not present

## 2016-08-02 DIAGNOSIS — Z87891 Personal history of nicotine dependence: Secondary | ICD-10-CM | POA: Diagnosis not present

## 2016-08-02 DIAGNOSIS — Z79899 Other long term (current) drug therapy: Secondary | ICD-10-CM | POA: Diagnosis not present

## 2016-08-02 DIAGNOSIS — I2510 Atherosclerotic heart disease of native coronary artery without angina pectoris: Secondary | ICD-10-CM | POA: Diagnosis not present

## 2016-08-02 DIAGNOSIS — Z9089 Acquired absence of other organs: Secondary | ICD-10-CM | POA: Diagnosis not present

## 2016-08-02 DIAGNOSIS — S12100G Unspecified displaced fracture of second cervical vertebra, subsequent encounter for fracture with delayed healing: Secondary | ICD-10-CM | POA: Diagnosis not present

## 2016-08-02 DIAGNOSIS — R339 Retention of urine, unspecified: Secondary | ICD-10-CM | POA: Diagnosis not present

## 2016-08-02 DIAGNOSIS — E785 Hyperlipidemia, unspecified: Secondary | ICD-10-CM | POA: Diagnosis not present

## 2016-08-02 DIAGNOSIS — Z9049 Acquired absence of other specified parts of digestive tract: Secondary | ICD-10-CM | POA: Diagnosis not present

## 2016-08-06 DIAGNOSIS — Z8679 Personal history of other diseases of the circulatory system: Secondary | ICD-10-CM | POA: Diagnosis not present

## 2016-08-06 DIAGNOSIS — S12100G Unspecified displaced fracture of second cervical vertebra, subsequent encounter for fracture with delayed healing: Secondary | ICD-10-CM | POA: Diagnosis not present

## 2016-08-06 DIAGNOSIS — R58 Hemorrhage, not elsewhere classified: Secondary | ICD-10-CM | POA: Diagnosis not present

## 2016-08-06 DIAGNOSIS — R29810 Facial weakness: Secondary | ICD-10-CM | POA: Diagnosis not present

## 2016-08-06 DIAGNOSIS — M6281 Muscle weakness (generalized): Secondary | ICD-10-CM | POA: Diagnosis not present

## 2016-08-06 DIAGNOSIS — R1900 Intra-abdominal and pelvic swelling, mass and lump, unspecified site: Secondary | ICD-10-CM | POA: Diagnosis not present

## 2016-08-06 DIAGNOSIS — D62 Acute posthemorrhagic anemia: Secondary | ICD-10-CM | POA: Diagnosis not present

## 2016-08-06 DIAGNOSIS — R410 Disorientation, unspecified: Secondary | ICD-10-CM | POA: Diagnosis not present

## 2016-08-06 DIAGNOSIS — I2699 Other pulmonary embolism without acute cor pulmonale: Secondary | ICD-10-CM | POA: Diagnosis not present

## 2016-08-06 DIAGNOSIS — N3001 Acute cystitis with hematuria: Secondary | ICD-10-CM | POA: Diagnosis not present

## 2016-08-06 DIAGNOSIS — Z7901 Long term (current) use of anticoagulants: Secondary | ICD-10-CM | POA: Diagnosis not present

## 2016-08-06 DIAGNOSIS — N281 Cyst of kidney, acquired: Secondary | ICD-10-CM | POA: Diagnosis not present

## 2016-08-06 DIAGNOSIS — E785 Hyperlipidemia, unspecified: Secondary | ICD-10-CM | POA: Diagnosis not present

## 2016-08-06 DIAGNOSIS — R402421 Glasgow coma scale score 9-12, in the field [EMT or ambulance]: Secondary | ICD-10-CM | POA: Diagnosis not present

## 2016-08-06 DIAGNOSIS — I700 Atherosclerosis of aorta: Secondary | ICD-10-CM | POA: Diagnosis not present

## 2016-08-06 DIAGNOSIS — J690 Pneumonitis due to inhalation of food and vomit: Secondary | ICD-10-CM | POA: Diagnosis not present

## 2016-08-06 DIAGNOSIS — N390 Urinary tract infection, site not specified: Secondary | ICD-10-CM | POA: Diagnosis not present

## 2016-08-06 DIAGNOSIS — I82401 Acute embolism and thrombosis of unspecified deep veins of right lower extremity: Secondary | ICD-10-CM | POA: Diagnosis not present

## 2016-08-06 DIAGNOSIS — J9601 Acute respiratory failure with hypoxia: Secondary | ICD-10-CM | POA: Diagnosis not present

## 2016-08-06 DIAGNOSIS — Z86711 Personal history of pulmonary embolism: Secondary | ICD-10-CM | POA: Diagnosis not present

## 2016-08-06 DIAGNOSIS — I10 Essential (primary) hypertension: Secondary | ICD-10-CM | POA: Diagnosis not present

## 2016-08-06 DIAGNOSIS — I959 Hypotension, unspecified: Secondary | ICD-10-CM | POA: Diagnosis not present

## 2016-08-06 DIAGNOSIS — R109 Unspecified abdominal pain: Secondary | ICD-10-CM | POA: Diagnosis not present

## 2016-08-06 DIAGNOSIS — I82531 Chronic embolism and thrombosis of right popliteal vein: Secondary | ICD-10-CM | POA: Diagnosis not present

## 2016-08-06 DIAGNOSIS — R0602 Shortness of breath: Secondary | ICD-10-CM | POA: Diagnosis not present

## 2016-08-06 DIAGNOSIS — I714 Abdominal aortic aneurysm, without rupture: Secondary | ICD-10-CM | POA: Diagnosis not present

## 2016-08-06 DIAGNOSIS — S3662XA Contusion of rectum, initial encounter: Secondary | ICD-10-CM | POA: Diagnosis not present

## 2016-08-06 DIAGNOSIS — I639 Cerebral infarction, unspecified: Secondary | ICD-10-CM | POA: Diagnosis not present

## 2016-08-06 DIAGNOSIS — T81718A Complication of other artery following a procedure, not elsewhere classified, initial encounter: Secondary | ICD-10-CM | POA: Diagnosis not present

## 2016-08-06 DIAGNOSIS — R1312 Dysphagia, oropharyngeal phase: Secondary | ICD-10-CM | POA: Diagnosis not present

## 2016-08-06 DIAGNOSIS — Z4682 Encounter for fitting and adjustment of non-vascular catheter: Secondary | ICD-10-CM | POA: Diagnosis not present

## 2016-08-06 DIAGNOSIS — S300XXA Contusion of lower back and pelvis, initial encounter: Secondary | ICD-10-CM | POA: Diagnosis not present

## 2016-08-06 DIAGNOSIS — I313 Pericardial effusion (noninflammatory): Secondary | ICD-10-CM | POA: Diagnosis not present

## 2016-08-06 DIAGNOSIS — I517 Cardiomegaly: Secondary | ICD-10-CM | POA: Diagnosis not present

## 2016-08-06 DIAGNOSIS — I82511 Chronic embolism and thrombosis of right femoral vein: Secondary | ICD-10-CM | POA: Diagnosis not present

## 2016-08-06 DIAGNOSIS — N398 Other specified disorders of urinary system: Secondary | ICD-10-CM | POA: Diagnosis not present

## 2016-08-06 DIAGNOSIS — G92 Toxic encephalopathy: Secondary | ICD-10-CM | POA: Diagnosis not present

## 2016-08-07 DIAGNOSIS — T81718A Complication of other artery following a procedure, not elsewhere classified, initial encounter: Secondary | ICD-10-CM | POA: Diagnosis not present

## 2016-08-07 DIAGNOSIS — N281 Cyst of kidney, acquired: Secondary | ICD-10-CM | POA: Diagnosis not present

## 2016-08-07 DIAGNOSIS — I2699 Other pulmonary embolism without acute cor pulmonale: Secondary | ICD-10-CM | POA: Diagnosis not present

## 2016-08-22 IMAGING — DX DG WRIST COMPLETE 3+V*L*
4 series · 4 of 4 positions shown · non-contrast
Comparison: None.

CLINICAL DATA: Syncope, fall, left wrist pain

EXAM:
LEFT WRIST - COMPLETE 3+ VIEW

[wrist pa]
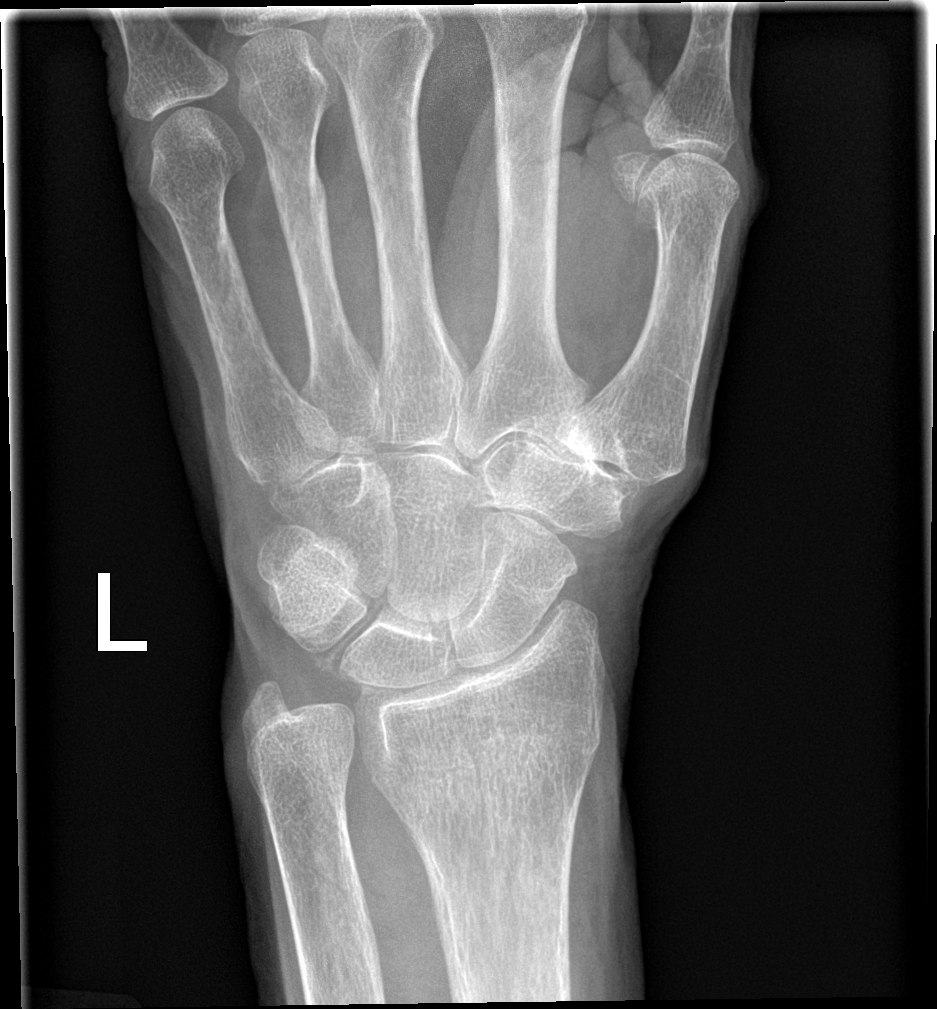

[wrist obl]
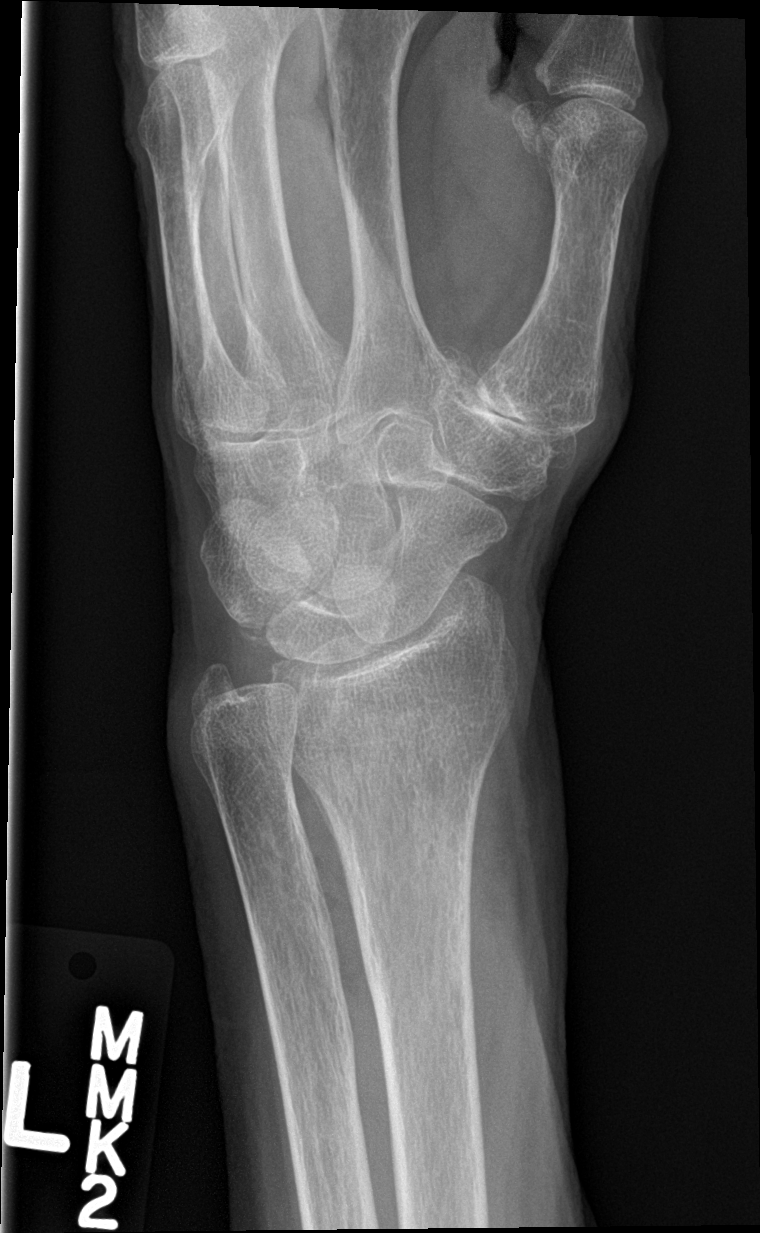

[wrist lat]
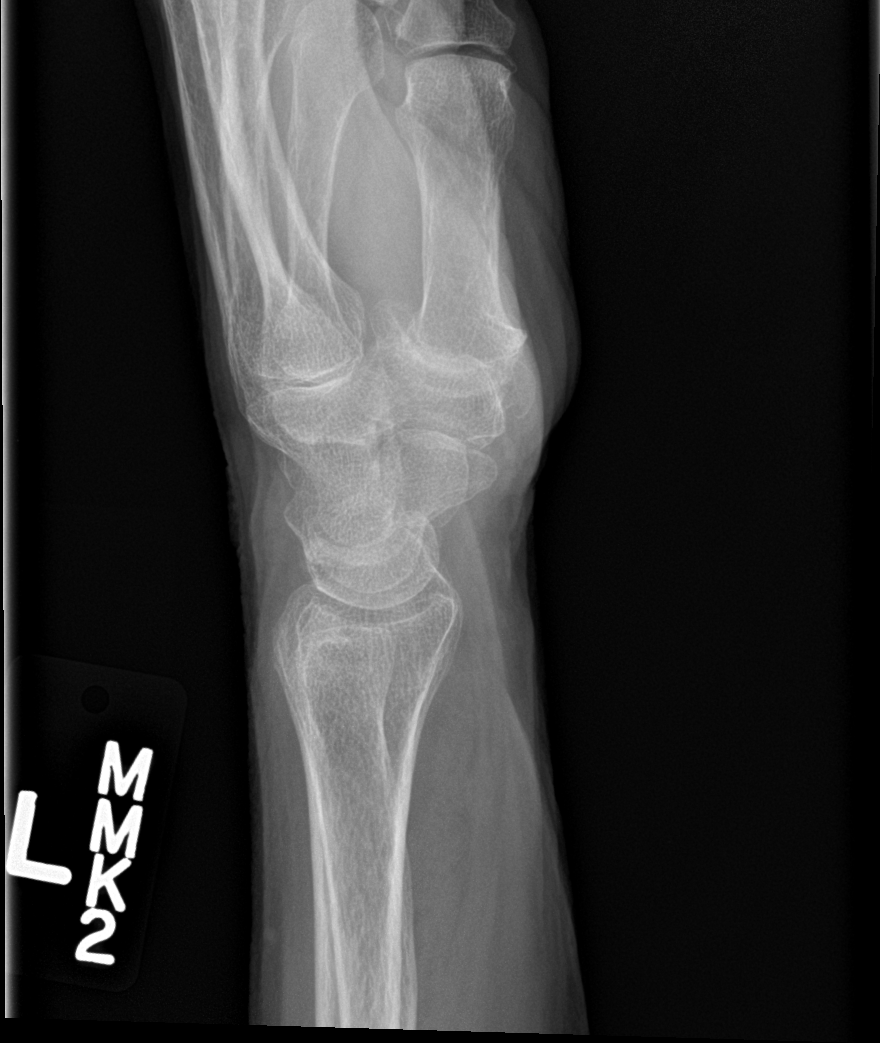

[wrist navicular]
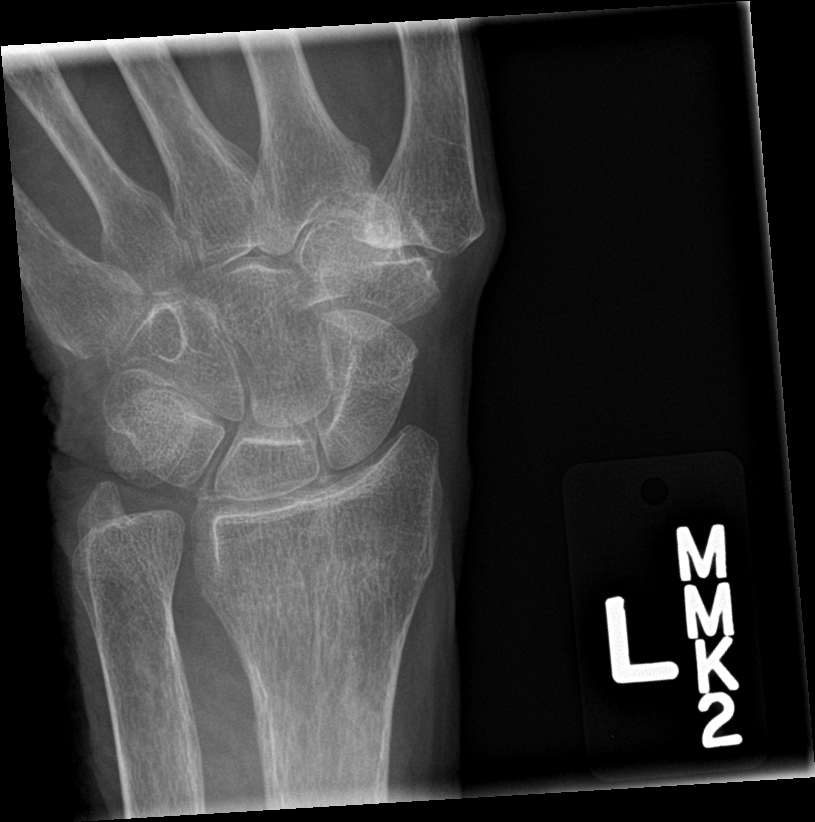

[4 of 4 positions shown; findings below may reference images not displayed]

FINDINGS: Four views of the left wrist submitted. No acute fracture or
subluxation. Mild chondrocalcinosis. Mild narrowing of radiocarpal
joint space. Degenerative changes first carpometacarpal joint.
IMPRESSION: No acute fracture or subluxation. Mild chondrocalcinosis. Narrowing
of radiocarpal joint space. Degenerative changes first
carpometacarpal joint.

## 2016-08-28 DIAGNOSIS — K5901 Slow transit constipation: Secondary | ICD-10-CM | POA: Diagnosis not present

## 2016-08-28 DIAGNOSIS — I82401 Acute embolism and thrombosis of unspecified deep veins of right lower extremity: Secondary | ICD-10-CM | POA: Diagnosis not present

## 2016-08-28 DIAGNOSIS — M7981 Nontraumatic hematoma of soft tissue: Secondary | ICD-10-CM | POA: Diagnosis not present

## 2016-08-28 DIAGNOSIS — I639 Cerebral infarction, unspecified: Secondary | ICD-10-CM | POA: Diagnosis not present

## 2016-08-28 DIAGNOSIS — M6281 Muscle weakness (generalized): Secondary | ICD-10-CM | POA: Diagnosis not present

## 2016-08-28 DIAGNOSIS — I2699 Other pulmonary embolism without acute cor pulmonale: Secondary | ICD-10-CM | POA: Diagnosis not present

## 2016-08-28 DIAGNOSIS — I10 Essential (primary) hypertension: Secondary | ICD-10-CM | POA: Diagnosis not present

## 2016-08-28 DIAGNOSIS — R1312 Dysphagia, oropharyngeal phase: Secondary | ICD-10-CM | POA: Diagnosis not present

## 2016-08-28 DIAGNOSIS — N3001 Acute cystitis with hematuria: Secondary | ICD-10-CM | POA: Diagnosis not present

## 2016-08-28 DIAGNOSIS — Z86711 Personal history of pulmonary embolism: Secondary | ICD-10-CM | POA: Diagnosis not present

## 2016-08-30 DIAGNOSIS — M7981 Nontraumatic hematoma of soft tissue: Secondary | ICD-10-CM | POA: Diagnosis not present

## 2016-08-30 DIAGNOSIS — I82401 Acute embolism and thrombosis of unspecified deep veins of right lower extremity: Secondary | ICD-10-CM | POA: Diagnosis not present

## 2016-08-30 DIAGNOSIS — K5901 Slow transit constipation: Secondary | ICD-10-CM | POA: Diagnosis not present

## 2016-08-30 DIAGNOSIS — I2699 Other pulmonary embolism without acute cor pulmonale: Secondary | ICD-10-CM | POA: Diagnosis not present

## 2016-09-17 DIAGNOSIS — E861 Hypovolemia: Secondary | ICD-10-CM | POA: Diagnosis not present

## 2016-09-17 DIAGNOSIS — I2129 ST elevation (STEMI) myocardial infarction involving other sites: Secondary | ICD-10-CM | POA: Diagnosis not present

## 2016-09-17 DIAGNOSIS — R112 Nausea with vomiting, unspecified: Secondary | ICD-10-CM | POA: Diagnosis not present

## 2016-09-17 DIAGNOSIS — I5020 Unspecified systolic (congestive) heart failure: Secondary | ICD-10-CM | POA: Diagnosis not present

## 2016-09-17 DIAGNOSIS — Z4682 Encounter for fitting and adjustment of non-vascular catheter: Secondary | ICD-10-CM | POA: Diagnosis not present

## 2016-09-17 DIAGNOSIS — A419 Sepsis, unspecified organism: Secondary | ICD-10-CM | POA: Diagnosis not present

## 2016-09-17 DIAGNOSIS — R509 Fever, unspecified: Secondary | ICD-10-CM | POA: Diagnosis not present

## 2016-09-17 DIAGNOSIS — Z9889 Other specified postprocedural states: Secondary | ICD-10-CM | POA: Diagnosis not present

## 2016-09-17 DIAGNOSIS — R339 Retention of urine, unspecified: Secondary | ICD-10-CM | POA: Diagnosis not present

## 2016-09-17 DIAGNOSIS — I493 Ventricular premature depolarization: Secondary | ICD-10-CM | POA: Diagnosis not present

## 2016-09-17 DIAGNOSIS — R402241 Coma scale, best verbal response, confused conversation, in the field [EMT or ambulance]: Secondary | ICD-10-CM | POA: Diagnosis not present

## 2016-09-17 DIAGNOSIS — Z9911 Dependence on respirator [ventilator] status: Secondary | ICD-10-CM | POA: Diagnosis not present

## 2016-09-17 DIAGNOSIS — G9341 Metabolic encephalopathy: Secondary | ICD-10-CM | POA: Diagnosis not present

## 2016-09-17 DIAGNOSIS — I4519 Other right bundle-branch block: Secondary | ICD-10-CM | POA: Diagnosis not present

## 2016-09-17 DIAGNOSIS — Z8249 Family history of ischemic heart disease and other diseases of the circulatory system: Secondary | ICD-10-CM | POA: Diagnosis not present

## 2016-09-17 DIAGNOSIS — I129 Hypertensive chronic kidney disease with stage 1 through stage 4 chronic kidney disease, or unspecified chronic kidney disease: Secondary | ICD-10-CM | POA: Diagnosis not present

## 2016-09-17 DIAGNOSIS — N139 Obstructive and reflux uropathy, unspecified: Secondary | ICD-10-CM | POA: Diagnosis not present

## 2016-09-17 DIAGNOSIS — R402141 Coma scale, eyes open, spontaneous, in the field [EMT or ambulance]: Secondary | ICD-10-CM | POA: Diagnosis not present

## 2016-09-17 DIAGNOSIS — I361 Nonrheumatic tricuspid (valve) insufficiency: Secondary | ICD-10-CM | POA: Diagnosis not present

## 2016-09-17 DIAGNOSIS — R6521 Severe sepsis with septic shock: Secondary | ICD-10-CM | POA: Diagnosis not present

## 2016-09-17 DIAGNOSIS — I2109 ST elevation (STEMI) myocardial infarction involving other coronary artery of anterior wall: Secondary | ICD-10-CM | POA: Diagnosis not present

## 2016-09-17 DIAGNOSIS — E7800 Pure hypercholesterolemia, unspecified: Secondary | ICD-10-CM | POA: Diagnosis not present

## 2016-09-17 DIAGNOSIS — K810 Acute cholecystitis: Secondary | ICD-10-CM | POA: Diagnosis not present

## 2016-09-17 DIAGNOSIS — I2582 Chronic total occlusion of coronary artery: Secondary | ICD-10-CM | POA: Diagnosis not present

## 2016-09-17 DIAGNOSIS — J449 Chronic obstructive pulmonary disease, unspecified: Secondary | ICD-10-CM | POA: Diagnosis not present

## 2016-09-17 DIAGNOSIS — I213 ST elevation (STEMI) myocardial infarction of unspecified site: Secondary | ICD-10-CM | POA: Diagnosis not present

## 2016-09-17 DIAGNOSIS — N170 Acute kidney failure with tubular necrosis: Secondary | ICD-10-CM | POA: Diagnosis not present

## 2016-09-17 DIAGNOSIS — I4891 Unspecified atrial fibrillation: Secondary | ICD-10-CM | POA: Diagnosis not present

## 2016-09-17 DIAGNOSIS — I501 Left ventricular failure: Secondary | ICD-10-CM | POA: Diagnosis not present

## 2016-09-17 DIAGNOSIS — R402361 Coma scale, best motor response, obeys commands, in the field [EMT or ambulance]: Secondary | ICD-10-CM | POA: Diagnosis not present

## 2016-09-17 DIAGNOSIS — R000 Tachycardia, unspecified: Secondary | ICD-10-CM | POA: Diagnosis not present

## 2016-09-17 DIAGNOSIS — K828 Other specified diseases of gallbladder: Secondary | ICD-10-CM | POA: Diagnosis not present

## 2016-09-17 DIAGNOSIS — I214 Non-ST elevation (NSTEMI) myocardial infarction: Secondary | ICD-10-CM | POA: Diagnosis not present

## 2016-09-17 DIAGNOSIS — R9431 Abnormal electrocardiogram [ECG] [EKG]: Secondary | ICD-10-CM | POA: Diagnosis not present

## 2016-09-17 DIAGNOSIS — E784 Other hyperlipidemia: Secondary | ICD-10-CM | POA: Diagnosis not present

## 2016-09-17 DIAGNOSIS — J90 Pleural effusion, not elsewhere classified: Secondary | ICD-10-CM | POA: Diagnosis not present

## 2016-09-17 DIAGNOSIS — J9600 Acute respiratory failure, unspecified whether with hypoxia or hypercapnia: Secondary | ICD-10-CM | POA: Diagnosis not present

## 2016-09-17 DIAGNOSIS — R570 Cardiogenic shock: Secondary | ICD-10-CM | POA: Diagnosis not present

## 2016-09-17 DIAGNOSIS — Z79899 Other long term (current) drug therapy: Secondary | ICD-10-CM | POA: Diagnosis not present

## 2016-09-17 DIAGNOSIS — S301XXA Contusion of abdominal wall, initial encounter: Secondary | ICD-10-CM | POA: Diagnosis not present

## 2016-09-17 DIAGNOSIS — I739 Peripheral vascular disease, unspecified: Secondary | ICD-10-CM | POA: Diagnosis not present

## 2016-09-17 DIAGNOSIS — I5189 Other ill-defined heart diseases: Secondary | ICD-10-CM | POA: Diagnosis not present

## 2016-09-17 DIAGNOSIS — R0602 Shortness of breath: Secondary | ICD-10-CM | POA: Diagnosis not present

## 2016-09-17 DIAGNOSIS — I10 Essential (primary) hypertension: Secondary | ICD-10-CM | POA: Diagnosis not present

## 2016-09-17 DIAGNOSIS — R079 Chest pain, unspecified: Secondary | ICD-10-CM | POA: Diagnosis not present

## 2016-09-17 DIAGNOSIS — I219 Acute myocardial infarction, unspecified: Secondary | ICD-10-CM | POA: Diagnosis not present

## 2016-09-17 DIAGNOSIS — Z452 Encounter for adjustment and management of vascular access device: Secondary | ICD-10-CM | POA: Diagnosis not present

## 2016-09-17 DIAGNOSIS — I119 Hypertensive heart disease without heart failure: Secondary | ICD-10-CM | POA: Diagnosis not present

## 2016-09-17 DIAGNOSIS — R072 Precordial pain: Secondary | ICD-10-CM | POA: Diagnosis not present

## 2016-09-17 DIAGNOSIS — N182 Chronic kidney disease, stage 2 (mild): Secondary | ICD-10-CM | POA: Diagnosis not present

## 2016-09-17 DIAGNOSIS — I340 Nonrheumatic mitral (valve) insufficiency: Secondary | ICD-10-CM | POA: Diagnosis not present

## 2016-09-17 DIAGNOSIS — I2510 Atherosclerotic heart disease of native coronary artery without angina pectoris: Secondary | ICD-10-CM | POA: Diagnosis not present

## 2016-09-17 DIAGNOSIS — E872 Acidosis: Secondary | ICD-10-CM | POA: Diagnosis not present

## 2016-09-17 DIAGNOSIS — Z86711 Personal history of pulmonary embolism: Secondary | ICD-10-CM | POA: Diagnosis not present

## 2016-09-17 DIAGNOSIS — K859 Acute pancreatitis without necrosis or infection, unspecified: Secondary | ICD-10-CM | POA: Diagnosis not present

## 2016-09-17 DIAGNOSIS — E785 Hyperlipidemia, unspecified: Secondary | ICD-10-CM | POA: Diagnosis not present

## 2016-09-17 DIAGNOSIS — D649 Anemia, unspecified: Secondary | ICD-10-CM | POA: Diagnosis not present

## 2016-10-02 DEATH — deceased

## 2017-04-29 IMAGING — CR DG CHEST 2V
2 series · 2 of 2 positions shown · non-contrast
Comparison: 08/31/2014

CLINICAL DATA: Altered mental status.

EXAM:
CHEST  2 VIEW

[w chest pa]
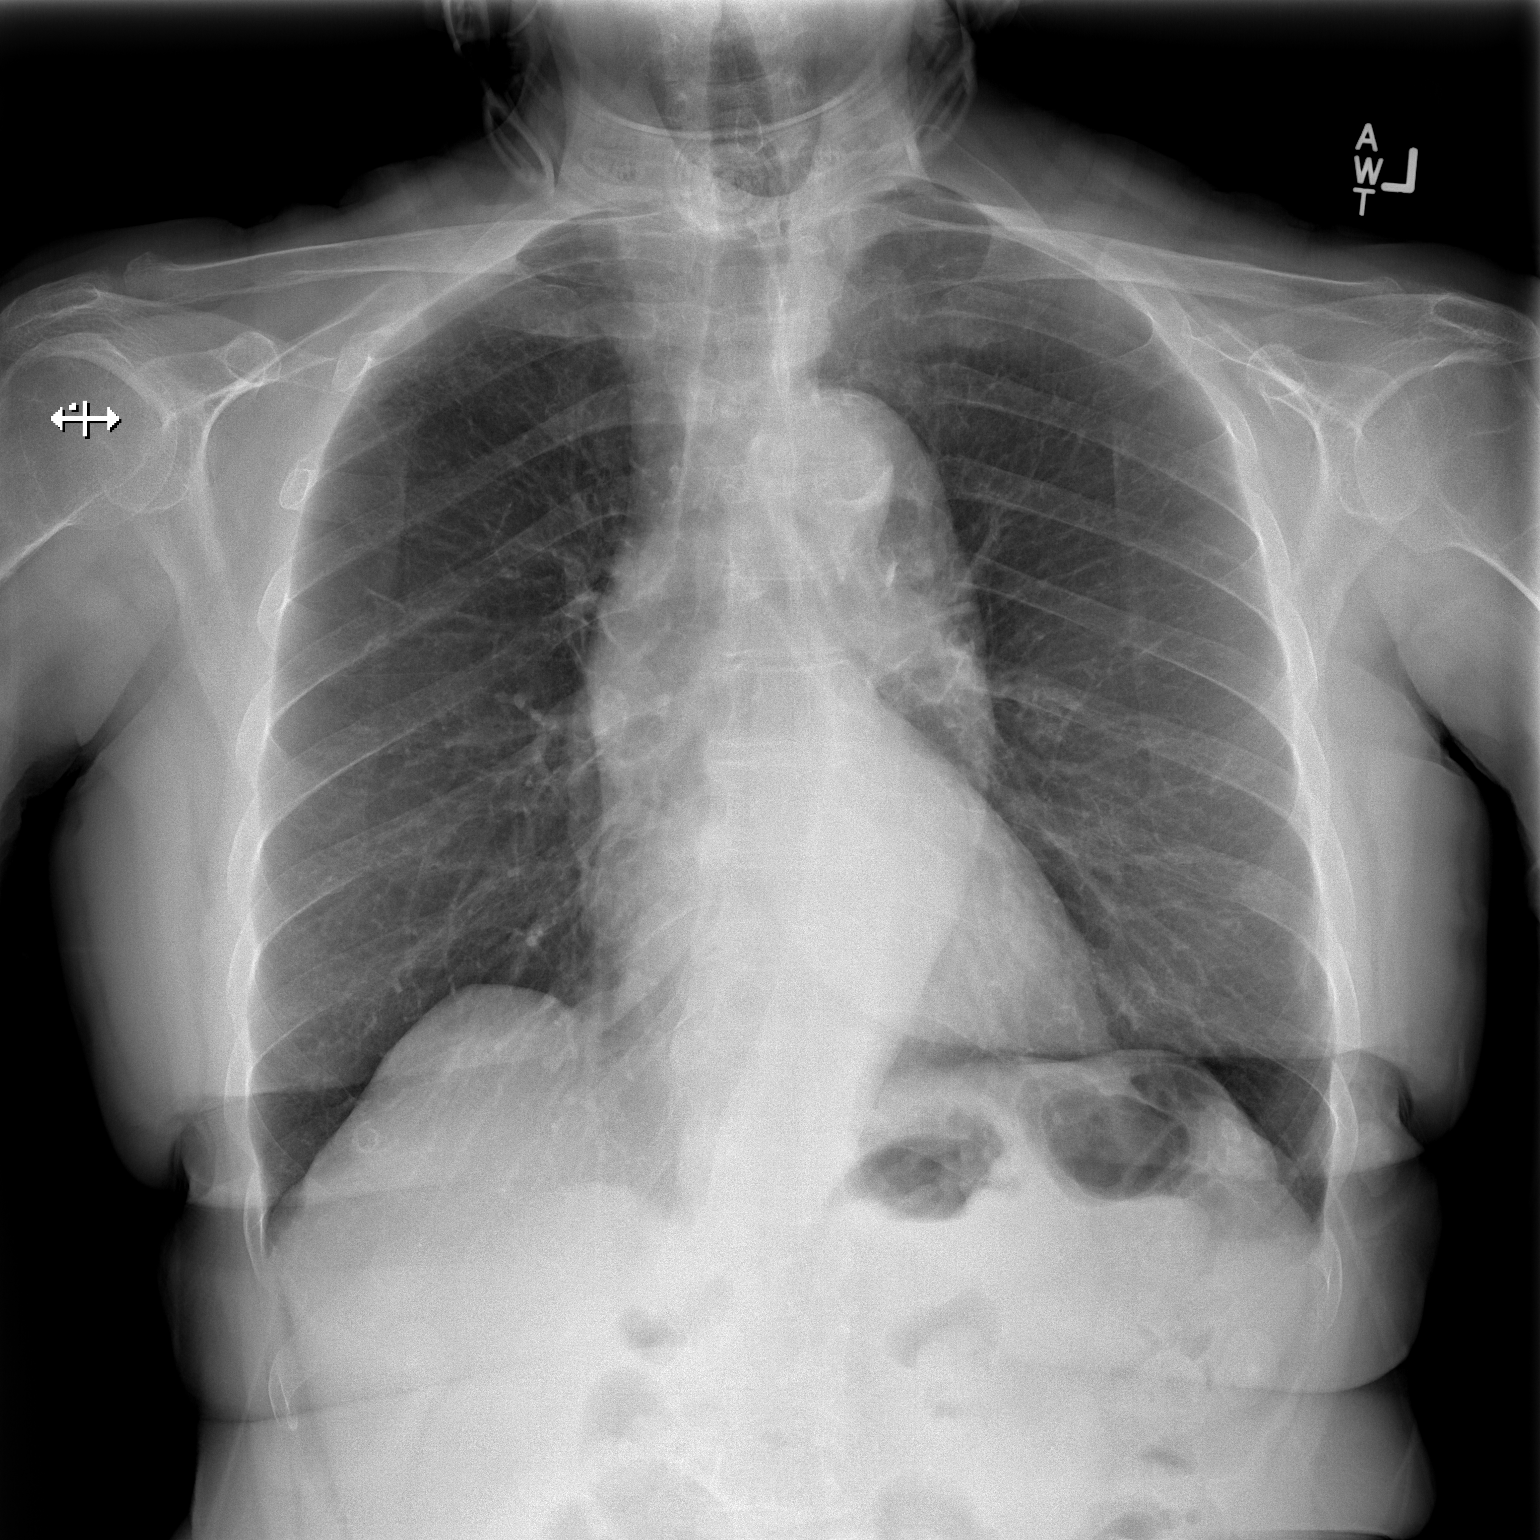

[w chest lat]
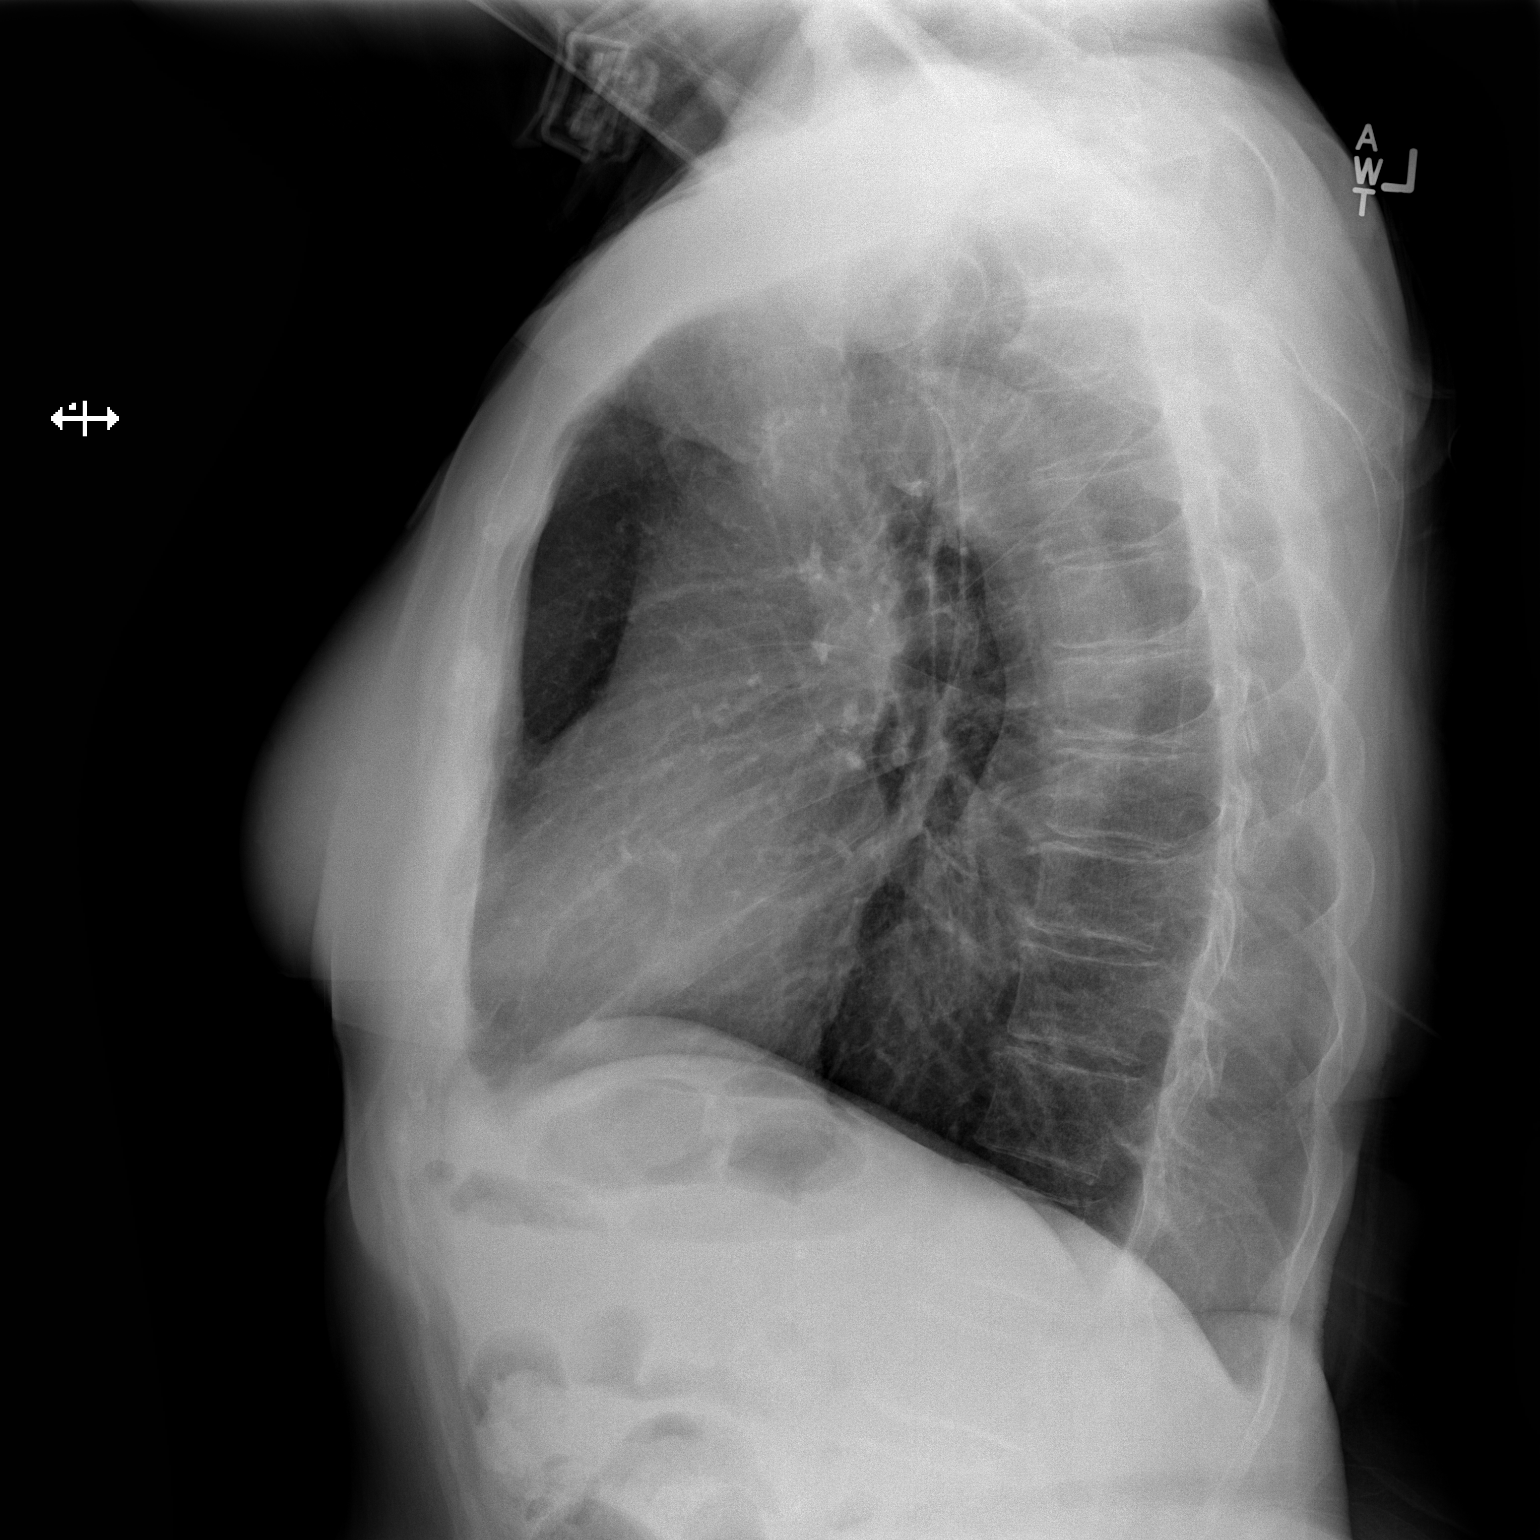

[2 of 2 positions shown; findings below may reference images not displayed]

FINDINGS: The heart size and mediastinal contours are within normal limits.
Both lungs are clear. The visualized skeletal structures are
unremarkable.
IMPRESSION: No active cardiopulmonary disease.
